# Patient Record
Sex: Male | Born: 1945 | Race: White | Hispanic: No | Marital: Married | Smoking: Never smoker
Health system: Southern US, Community
[De-identification: ages and names within clinical notes are randomized; demographics above are authoritative.]

## PROBLEM LIST (undated history)

## (undated) DIAGNOSIS — I7090 Unspecified atherosclerosis: Secondary | ICD-10-CM

## (undated) DIAGNOSIS — F419 Anxiety disorder, unspecified: Secondary | ICD-10-CM

## (undated) DIAGNOSIS — K769 Liver disease, unspecified: Secondary | ICD-10-CM

## (undated) DIAGNOSIS — K5790 Diverticulosis of intestine, part unspecified, without perforation or abscess without bleeding: Secondary | ICD-10-CM

## (undated) DIAGNOSIS — M1990 Unspecified osteoarthritis, unspecified site: Secondary | ICD-10-CM

## (undated) DIAGNOSIS — K219 Gastro-esophageal reflux disease without esophagitis: Secondary | ICD-10-CM

## (undated) DIAGNOSIS — I10 Essential (primary) hypertension: Secondary | ICD-10-CM

## (undated) DIAGNOSIS — E785 Hyperlipidemia, unspecified: Secondary | ICD-10-CM

## (undated) DIAGNOSIS — E119 Type 2 diabetes mellitus without complications: Secondary | ICD-10-CM

## (undated) DIAGNOSIS — K8070 Calculus of gallbladder and bile duct without cholecystitis without obstruction: Secondary | ICD-10-CM

## (undated) DIAGNOSIS — A419 Sepsis, unspecified organism: Secondary | ICD-10-CM

## (undated) HISTORY — PX: HEMORROIDECTOMY: SUR656

## (undated) HISTORY — PX: ELBOW ARTHROSCOPY: SUR87

## (undated) HISTORY — PX: EYE SURGERY: SHX253

## (undated) HISTORY — PX: COLONOSCOPY: SHX174

## (undated) HISTORY — PX: CATARACT EXTRACTION: SUR2

---

## 2007-11-25 ENCOUNTER — Ambulatory Visit: Payer: Self-pay | Admitting: Gastroenterology

## 2012-12-17 ENCOUNTER — Inpatient Hospital Stay: Payer: Self-pay | Admitting: Internal Medicine

## 2012-12-17 LAB — COMPREHENSIVE METABOLIC PANEL
Albumin: 4 g/dL (ref 3.4–5.0)
Anion Gap: 7 (ref 7–16)
BUN: 20 mg/dL — ABNORMAL HIGH (ref 7–18)
Bilirubin,Total: 0.6 mg/dL (ref 0.2–1.0)
Co2: 25 mmol/L (ref 21–32)
Creatinine: 1.01 mg/dL (ref 0.60–1.30)
EGFR (African American): >60
Osmolality: 276 (ref 275–301)
SGOT(AST): 26 U/L (ref 15–37)
SGPT (ALT): 31 U/L (ref 12–78)
Sodium: 136 mmol/L (ref 136–145)

## 2012-12-17 LAB — CBC
MCH: 32.6 pg (ref 26.0–34.0)
MCHC: 34.4 g/dL (ref 32.0–36.0)
WBC: 6.9 10*3/uL (ref 3.8–10.6)

## 2012-12-18 LAB — CBC WITH DIFFERENTIAL/PLATELET
Basophil #: 0.1 10*3/uL (ref 0.0–0.1)
Eosinophil #: 0 10*3/uL (ref 0.0–0.7)
Eosinophil %: 0.3 %
HCT: 36.6 % — ABNORMAL LOW (ref 40.0–52.0)
HGB: 12.6 g/dL — ABNORMAL LOW (ref 13.0–18.0)
Lymphocyte #: 1.4 10*3/uL (ref 1.0–3.6)
Lymphocyte %: 13.3 %
MCH: 32.8 pg (ref 26.0–34.0)
MCHC: 34.3 g/dL (ref 32.0–36.0)
MCV: 96 fL (ref 80–100)
Monocyte #: 0.9 x10 3/mm (ref 0.2–1.0)
Monocyte %: 9 %
Neutrophil #: 7.9 10*3/uL — ABNORMAL HIGH (ref 1.4–6.5)
Neutrophil %: 76.8 %
Platelet: 203 10*3/uL (ref 150–440)
RBC: 3.83 10*6/uL — ABNORMAL LOW (ref 4.40–5.90)
RDW: 13.5 % (ref 11.5–14.5)

## 2012-12-19 LAB — CBC WITH DIFFERENTIAL/PLATELET
Basophil %: 0.4 %
HCT: 31.7 % — ABNORMAL LOW (ref 40.0–52.0)
MCH: 33.3 pg (ref 26.0–34.0)
MCHC: 35 g/dL (ref 32.0–36.0)
Monocyte #: 1 x10 3/mm (ref 0.2–1.0)
Neutrophil #: 7.1 10*3/uL — ABNORMAL HIGH (ref 1.4–6.5)
Neutrophil %: 75.9 %
RDW: 13.3 % (ref 11.5–14.5)

## 2012-12-19 LAB — BASIC METABOLIC PANEL
Anion Gap: 5 — ABNORMAL LOW (ref 7–16)
Calcium, Total: 8.4 mg/dL — ABNORMAL LOW (ref 8.5–10.1)
Co2: 28 mmol/L (ref 21–32)
EGFR (Non-African Amer.): >60
Glucose: 102 mg/dL — ABNORMAL HIGH (ref 65–99)
Osmolality: 267 (ref 275–301)

## 2013-02-07 ENCOUNTER — Ambulatory Visit: Payer: Self-pay | Admitting: Gastroenterology

## 2013-05-11 ENCOUNTER — Ambulatory Visit: Payer: Self-pay | Admitting: Ophthalmology

## 2013-05-11 LAB — POTASSIUM: Potassium: 3.4 mmol/L — ABNORMAL LOW (ref 3.5–5.1)

## 2013-05-23 ENCOUNTER — Ambulatory Visit: Payer: Self-pay | Admitting: Ophthalmology

## 2014-05-12 NOTE — Consult Note (Signed)
Pt without further bleeding. Hgb 11.2.  Likely bled from diverticular disease.  I recommend continue clear liq diet for another 24 hours and try full liquids and if does ok could go home either tomorrow late afternoon or Monday.  I recommend prolonged clear liquids to decrease the chance that stool moving through will dislodge the clot on the diverticular site of bleeding while the clot organizes.   Electronic Signatures: Scot JunElliott, Robert T (MD)  (Signed on 29-Nov-14 10:45)  Authored  Last Updated: 29-Nov-14 10:45 by Scot JunElliott, Robert T (MD)

## 2014-05-12 NOTE — Consult Note (Signed)
Pt seen and examined. Full consult to follow. Low abd cramping with hematochezia since last night. Known hx of diverticulosis. Recent constipation and straining on defacation. Hgb stable. Hemorrhoids seen on exam. Likely diverticular bleed but hemorrhoidal bleeding also possible. Liquid diet ordered. Moniter for bleeding. Hemorhoidal cream bid.Please hold the ASA. Pt of Dr. Reyes IvanSkulskie's but Dr. Mechele CollinElliott will see tomorrow. Thanks.   Electronic Signatures: Lutricia Feilh, Zadiel Leyh (MD) (Signed on 28-Nov-14 13:08)  Authored   Last Updated: 28-Nov-14 15:16 by Lutricia Feilh, Jezreel Sisk (MD)

## 2014-05-12 NOTE — H&P (Signed)
PATIENT NAME:  Jesus White, [name]:  [id number] DATE OF BIRTH:  [birth year]  DATE OF ADMISSION:  12/17/2012  PRIMARY CARE PHYSICIAN: Marisue IvanKanhka Linthavong, MD   PRESENTING COMPLAINT: Rectal bleed.   HISTORY OF PRESENT ILLNESS: Jesus White is a very pleasant 69 year old Caucasian gentleman with history of diverticulosis, hypertension and hyperlipidemia, comes to the Emergency Room after he started having bright red blood per rectum x 3 episodes yesterday evening and 1 episode this morning, which was a milder episode. The patient said he ate [place] dinner yesterday and started having some growling in his stomach and went to use the bathroom, had 3 back to back, mild to moderate bright red blood per rectum. The patient has a history of constipation, has to strain each time he has a bowel movement. He has a history of diverticulosis but no history of rectal bleed in the past. He is hemodynamically stable, is being admitted for further evaluation and management.   PAST MEDICAL HISTORY: 1.  Diverticulosis.  2.  Hypertension.  3.  Hyperlipidemia.   ALLERGIES: No known drug allergies.   MEDICATIONS: 1.  Omeprazole 20 mg daily. 2.  Multivitamin p.o. daily.  3.  Lovastatin 40 mg 2 tablets daily.  4.  Lisinopril 20 mg 1 tablet daily.  5.  Hydrochlorothiazide 25 mg at bedtime.  6.  Doxepin 25 mg p.o. daily at bedtime.  7.  Aspirin 81 mg daily.  8.  Amlodipine 10 mg daily.   FAMILY HISTORY: Mother had congestive heart failure. Father died of cancer, but he does not know what kind. He has several siblings with hypertension.   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 Does not smoke or drink alcohol.   REVIEW OF SYSTEMS:    CONSTITUTIONAL: No fever, fatigue, weakness.  EYES: No blurred or double vision, glaucoma.  ENT: No tinnitus, ear pain, hearing loss.  RESPIRATORY: No cough, wheeze, hemoptysis, or COPD.  CARDIOVASCULAR: No chest pain, orthopnea, edema, palpitations, arrhythmia.   GASTROINTESTINAL: Positive for rectal bleed and constipation. No nausea, vomiting, or diarrhea.  GENITOURINARY: No dysuria or hematuria.  ENDOCRINE: No polyuria, nocturia, or thyroid problems.  HEMATOLOGY: No anemia or easy bruising or bleeding disorder.  SKIN: No acne, rash, or lesions or dermatitis.  MUSCULOSKELETAL: No arthritis, back pain, or gout.  NEUROLOGIC: No CVA, TIA, dysarthria, vertigo, or tremors.  PSYCHIATRIC: No anxiety, depression, or bipolar disorder.   All other systems reviewed and negative.   PHYSICAL EXAM: GENERAL: The patient is awake, alert, oriented x 3, not in acute distress.  VITAL SIGNS: Afebrile. Pulse is 93. Blood pressure is 169/97. Pulse oximetry is 98% on room air.  HEENT: Atraumatic, normocephalic. Pupils: PERRLA. EOM intact. Oral mucosa is moist.  NECK: Supple. No JVD. No carotid bruit.  LUNGS: Clear to auscultation bilaterally. No rales, rhonchi, respiratory distress, or labored breathing.  HEART: Both the heart sounds are normal. Rate, rhythm regular. PMI not lateralized. Chest nontender.  EXTREMITIES: Good pedal pulses, good femoral pulses. No lower extremity edema.  ABDOMEN: Soft, benign, nontender. No organomegaly. Positive bowel sounds.  NEUROLOGIC: Grossly intact cranial nerves II through XII. No motor or sensory deficit.  PSYCHIATRIC: The patient is awake, alert, oriented x 3.   LABORATORY DATA:  CBC within normal limits. Comprehensive metabolic panel within normal limits. Lipase is 172.   ASSESSMENT: A 69 year old Jesus White with history of hypertension and hyperlipidemia comes in with 3 to 4 bright red blood per rectal stools. He is being admitted with:  1.  Lower gastrointestinal bleed/rectal bleed/hematochezia: Suspected diverticular bleed versus could be hemorrhoidal as well since hemorrhoids were palpated on rectal exam per ER physician and per Dr. Bluford Kaufmann. The patient will be admitted on the medical floor. Will monitor hemoglobin and  hematocrit. Continue IV fluids. Will give him clear liquid diet and GI consultation with Dr. Bluford Kaufmann. The case was discussed with Dr. Bluford Kaufmann.  2.  Tachycardia, likely in the setting of rectal bleed: Will continue to monitor.  3.  Hypertension, uncontrolled: Will continue all home medications, which are amlodipine, hydrochlorothiazide, lisinopril.  4.  Hyperlipidemia: Continue statins.  5.  Deep vein thrombosis prophylaxis: The patient is ambulatory.  6.  Further work-up according to the patient's clinical course. Hospital admission plan was discussed with the patient and the patient's wife, who was present in the Emergency Room.   TIME SPENT: 55 minutes.    ____________________________ Wylie Hail Allena Katz, MD sap:jcm D: 12/17/2012 13:16:25 ET T: 12/17/2012 13:31:03 ET JOB#: [id number]  cc: Telitha Plath A. Allena Katz, MD, <Dictator> Willow Ora MD ELECTRONICALLY SIGNED 12/31/2012 11:06

## 2014-05-12 NOTE — Discharge Summary (Signed)
PATIENT NAME:  Jesus White, Remer H MR#:  [id number] DATE OF BIRTH:  Dec 16, 1945  DATE OF ADMISSION:  12/17/2012 DATE OF DISCHARGE:  12/19/2012  DISCHARGE DIAGNOSES: 1.  Anemia, acute from lower gastrointestinal bleed of unknown cause. 2.  Hyperlipidemia.  3.  Hypertension.   DISCHARGE MEDICATIONS: [facility]. Main changes reflect bleeding in that he will stop his aspirin for a few days until seen by Dr. Burnadette PopLinthavong later this week, then will consider restarting if necessary clinically per his primary care doctor. He also may use over-the-counter medication to combat GI constipation, specifically suggested stool softener daily and MiraLax as needed.   HISTORY AND PHYSICAL: Please see detailed history and physical done on admission.   HOSPITAL COURSE: The patient was admitted with rectal bleeding with a hemoglobin initially of 13, decreased to 11.2 with hydration. His bleeding stopped after being admitted. He was on a clear liquid diet and did well without any further bleeding. He was seen by 2 members of the GI service. Colonoscopy was elected to be done later. Since he tolerated p.o. well to this point, will advance this to a full liquid over the course of the day today and discharge him later in the afternoon if he bleeds no further. Discussed this with he and his wife in depth. Will replace his hypokalemia today as well prior to discharge.   TIME SPENT: It took approximately 35 minutes to do discharge tasks.   ____________________________ Marya AmslerMarshall W. Dareen PianoAnderson, MD mwa:cs D: 12/19/2012 10:41:05 ET T: 12/19/2012 19:28:56 ET JOB#: [id number]  cc: Marya AmslerMarshall W. Dareen PianoAnderson, MD, <Dictator> Lauro RegulusMARSHALL W Gorje Iyer MD ELECTRONICALLY SIGNED 12/20/2012 8:01

## 2014-05-12 NOTE — Consult Note (Signed)
PATIENT NAME:  Jesus White, [name] MR#:  [id number] DATE OF BIRTH:  Mar 11, 1945  DATE OF CONSULTATION:  12/17/2012  CONSULTING PHYSICIAN:  Ezzard StandingPaul Y. Bluford Kaufmannh, MD  REASON FOR REFERRAL: Rectal bleeding.   HISTORY OF PRESENT ILLNESS: The patient is a 69 year old white male with a known history of diverticulosis who comes to the Emergency Room with bright red blood per rectum that started last night, associated with some lower abdominal discomfort and cramping. He also had a bout of rectal bleeding this morning. He ate a [place] dinner last night and had some growling in the stomach, after which he started having some bleeding. He has been having some constipation issues and had to strain each time he had a bowel movement.   He had a colonoscopy by Dr. Marva PandaSkulskie in 2009, which clearly showed evidence of left-sided diverticulosis. The patient has had diverticulosis in the past. The patient is currently stable without any complaints.   PAST MEDICAL HISTORY: Notable for hypertension, hyperlipidemia, and diverticulosis.   ALLERGIES: He has no known drug allergies.   MEDICATIONS: Include omeprazole 20 mg daily, lovastatin 40 mg twice a day, lisinopril 20 mg a day, hydrochlorothiazide 25 mg at bedtime, baby aspirin, doxepin at bedtime, and multivitamins. He also takes amlodipine 10 mg daily.  FAMILY HISTORY: Notable for cancer and heart failure and hypertension.   SOCIAL HISTORY: The patient does not smoke or drink.   REVIEW OF SYSTEMS: There are no fevers or chills or fatigue or weakness. There is no chest pain or palpitations or cough. No shortness of breath. There are no visual or hearing changes. GI symptoms have been described already. There is no nausea, vomiting, or heartburn or indigestion.   PHYSICAL EXAMINATION:   GENERAL: He is in no acute distress.  VITAL SIGNS: He is afebrile. Vital signs are stable.  HEAD AND NECK: Exams were within normal limits. CARDIAC: Regular rhythm and rate.  LUNGS: Clear  bilaterally.  ABDOMEN: Normoactive bowel sounds, soft. There is some mild tenderness in the lower abdomen. Has active bowel signs.  EXTREMITIES: No edema.  RECTAL: Examination in the Emergency Room showed evidence of heme-positive stool and external hemorrhoids.  LABORATORY DATA: Electrolytes are normal. BUN is 20. Creatinine is 1.01. Liver enzymes are normal. White count is 6.9. Hemoglobin is 13.0.   ASSESSMENT AND PLAN: This is a patient with known history of diverticulosis who has rectal bleeding. It is likely that he has diverticular bleed. However, he could also have hemorrhoidal bleeding. I will recommend giving some hemorrhoidal creams. We need to monitor his hemoglobin. We should hold the aspirin for now. Hopefully the bleeding will stopped. If it does not stop, then further evaluation will need to be done over the next few days. Dr. Mechele CollinElliott will see the patient tomorrow. Dr. Marva PandaSkulskie is his primary GI doctor.   Thank you for the referral.   ____________________________ Ezzard StandingPaul Y. Bluford Kaufmannh, MD pyo:jcm D: 12/17/2012 15:16:04 ET T: 12/17/2012 15:44:55 ET JOB#: [id number]  cc: Ezzard StandingPaul Y. Bluford Kaufmannh, MD, <Dictator> Ezzard StandingPAUL Y Billie Trager MD ELECTRONICALLY SIGNED 12/18/2012 11:36

## 2014-05-13 NOTE — Op Note (Signed)
PATIENT NAME:  Jesus White, Jesus White MR#:  [id number] DATE OF BIRTH:  Aug 22, 1945  DATE OF PROCEDURE:  05/23/2013  PREOPERATIVE DIAGNOSIS: Cataract, right eye.   POSTOPERATIVE DIAGNOSIS: Cataract, right eye.  PROCEDURE PERFORMED:  Extracapsular cataract extraction using phacoemulsification with placement Alcon [id number], 19.0 diopter posterior chamber lens, serial number [id number].   SURGEON: Maylon PeppersSteven A. Jaisean Monteforte, M.D.   ANESTHESIA: 4% lidocaine and 0.75% Marcaine a 50-50 mixture with 10 units/mL of Hylenex added, given as a peribulbar.   ANESTHESIOLOGIST: Dr. Henrene HawkingKephart.   COMPLICATIONS: None.   ESTIMATED BLOOD LOSS: Less than 1 mL.   DESCRIPTION OF PROCEDURE:  The patient was brought to the operating room and given a peribulbar block.  The patient was then prepped and draped in the usual fashion.  The vertical rectus muscles were imbricated using 5-0 silk sutures.  These sutures were then clamped to the sterile drapes as bridle sutures.  A limbal peritomy was performed extending two clock hours and hemostasis was obtained with cautery.  A partial thickness scleral groove was made at the surgical limbus and dissected anteriorly in a lamellar dissection using an Alcon crescent knife.  The anterior chamber was entered superonasally with a Superblade and through the lamellar dissection with a 2.6 mm keratome.  DisCoVisc was used to replace the aqueous and a continuous tear capsulorrhexis was carried out.  Hydrodissection and hydrodelineation were carried out with balanced salt and a 27 gauge canula.  The nucleus was rotated to confirm the effectiveness of the hydrodissection.  Phacoemulsification was carried out using a divide-and-conquer technique.  Total ultrasound time was 57.5 seconds with an average power of 23.9%. CDE of 24.27.  Irrigation/aspiration was used to remove the residual cortex.  DisCoVisc was used to inflate the capsule and the internal incision was enlarged to 3 mm with the crescent  knife.  The intraocular lens was folded and inserted into the capsular bag using the AcrySert delivery system.  Irrigation/aspiration was used to remove the residual DisCoVisc.  Miostat was injected into the anterior chamber through the paracentesis track to inflate the anterior chamber and induce miosis.  0.1 mL of cefuroxime was injected via the paracentesis tract containing 1 mg of drug.  The wound was checked for leaks and none were found. The conjunctiva was closed with cautery and the bridle sutures were removed.  Two drops of 0.3% Vigamox were placed on the eye.   An eye shield was placed on the eye.  The patient was discharged to the recovery room in good condition.    ____________________________ Maylon PeppersSteven A. Adalyn Pennock, MD sad:dmm D: 05/23/2013 12:47:07 ET T: 05/23/2013 13:07:40 ET JOB#: [id number]  cc: Viviann SpareSteven A. Patriciaann Rabanal, MD, <Dictator> Erline LevineSTEVEN A Kazue Cerro MD ELECTRONICALLY SIGNED 05/30/2013 11:38

## 2014-11-04 ENCOUNTER — Emergency Department: Payer: Medicare Other

## 2014-11-04 ENCOUNTER — Encounter (HOSPITAL_COMMUNITY): Payer: Self-pay | Admitting: Internal Medicine

## 2014-11-04 ENCOUNTER — Inpatient Hospital Stay (HOSPITAL_COMMUNITY)
Admission: AD | Admit: 2014-11-04 | Discharge: 2014-11-08 | DRG: 417 | Disposition: A | Discharge status: Home / Self Care | Payer: Medicare Other | Source: Other Acute Inpatient Hospital | Attending: Internal Medicine | Admitting: Internal Medicine

## 2014-11-04 ENCOUNTER — Emergency Department
Admission: EM | Admit: 2014-11-04 | Discharge: 2014-11-04 | Disposition: A | Discharge status: Other Acute Inpatient Hospital | Payer: Medicare Other | Attending: Student | Admitting: Student

## 2014-11-04 DIAGNOSIS — E785 Hyperlipidemia, unspecified: Secondary | ICD-10-CM | POA: Diagnosis present

## 2014-11-04 DIAGNOSIS — R509 Fever, unspecified: Secondary | ICD-10-CM

## 2014-11-04 DIAGNOSIS — R410 Disorientation, unspecified: Secondary | ICD-10-CM | POA: Insufficient documentation

## 2014-11-04 DIAGNOSIS — K8064 Calculus of gallbladder and bile duct with chronic cholecystitis without obstruction: Secondary | ICD-10-CM | POA: Diagnosis present

## 2014-11-04 DIAGNOSIS — I2510 Atherosclerotic heart disease of native coronary artery without angina pectoris: Secondary | ICD-10-CM | POA: Diagnosis present

## 2014-11-04 DIAGNOSIS — K769 Liver disease, unspecified: Secondary | ICD-10-CM | POA: Diagnosis not present

## 2014-11-04 DIAGNOSIS — I10 Essential (primary) hypertension: Secondary | ICD-10-CM | POA: Diagnosis present

## 2014-11-04 DIAGNOSIS — K8050 Calculus of bile duct without cholangitis or cholecystitis without obstruction: Secondary | ICD-10-CM | POA: Diagnosis not present

## 2014-11-04 DIAGNOSIS — K830 Cholangitis: Secondary | ICD-10-CM | POA: Diagnosis present

## 2014-11-04 DIAGNOSIS — E872 Acidosis, unspecified: Secondary | ICD-10-CM

## 2014-11-04 DIAGNOSIS — K8013 Calculus of gallbladder with acute and chronic cholecystitis with obstruction: Principal | ICD-10-CM | POA: Diagnosis present

## 2014-11-04 DIAGNOSIS — R4182 Altered mental status, unspecified: Secondary | ICD-10-CM | POA: Diagnosis present

## 2014-11-04 DIAGNOSIS — G9340 Encephalopathy, unspecified: Secondary | ICD-10-CM | POA: Diagnosis present

## 2014-11-04 DIAGNOSIS — R52 Pain, unspecified: Secondary | ICD-10-CM

## 2014-11-04 DIAGNOSIS — R740 Nonspecific elevation of levels of transaminase and lactic acid dehydrogenase [LDH]: Secondary | ICD-10-CM | POA: Diagnosis not present

## 2014-11-04 DIAGNOSIS — R7401 Elevation of levels of liver transaminase levels: Secondary | ICD-10-CM | POA: Diagnosis present

## 2014-11-04 DIAGNOSIS — R000 Tachycardia, unspecified: Secondary | ICD-10-CM | POA: Diagnosis not present

## 2014-11-04 DIAGNOSIS — E876 Hypokalemia: Secondary | ICD-10-CM | POA: Diagnosis present

## 2014-11-04 DIAGNOSIS — F419 Anxiety disorder, unspecified: Secondary | ICD-10-CM | POA: Diagnosis not present

## 2014-11-04 DIAGNOSIS — R6510 Systemic inflammatory response syndrome (SIRS) of non-infectious origin without acute organ dysfunction: Secondary | ICD-10-CM | POA: Diagnosis present

## 2014-11-04 DIAGNOSIS — A419 Sepsis, unspecified organism: Secondary | ICD-10-CM | POA: Insufficient documentation

## 2014-11-04 DIAGNOSIS — K8070 Calculus of gallbladder and bile duct without cholecystitis without obstruction: Secondary | ICD-10-CM | POA: Diagnosis present

## 2014-11-04 HISTORY — DX: Essential (primary) hypertension: I10

## 2014-11-04 HISTORY — DX: Sepsis, unspecified organism: A41.9

## 2014-11-04 HISTORY — DX: Liver disease, unspecified: K76.9

## 2014-11-04 HISTORY — DX: Diverticulosis of intestine, part unspecified, without perforation or abscess without bleeding: K57.90

## 2014-11-04 HISTORY — DX: Calculus of gallbladder and bile duct without cholecystitis without obstruction: K80.70

## 2014-11-04 HISTORY — DX: Unspecified atherosclerosis: I70.90

## 2014-11-04 HISTORY — DX: Hyperlipidemia, unspecified: E78.5

## 2014-11-04 HISTORY — DX: Anxiety disorder, unspecified: F41.9

## 2014-11-04 LAB — COMPREHENSIVE METABOLIC PANEL
ALBUMIN: 3.8 g/dL (ref 3.5–5.0)
ALBUMIN: 2.8 g/dL — AB (ref 3.5–5.0)
ALK PHOS: 131 U/L — AB (ref 38–126)
ALT: 188 U/L — AB (ref 17–63)
ALT: 140 U/L — AB (ref 17–63)
ANION GAP: 10 (ref 5–15)
AST: 85 U/L — AB (ref 15–41)
AST: 59 U/L — AB (ref 15–41)
Alkaline Phosphatase: 180 U/L — ABNORMAL HIGH (ref 38–126)
Anion gap: 16 — ABNORMAL HIGH (ref 5–15)
BILIRUBIN TOTAL: 1.3 mg/dL — AB (ref 0.3–1.2)
BUN: 13 mg/dL (ref 6–20)
BUN: 7 mg/dL (ref 6–20)
CHLORIDE: 100 mmol/L — AB (ref 101–111)
CHLORIDE: 97 mmol/L — AB (ref 101–111)
CO2: 23 mmol/L (ref 22–32)
CO2: 25 mmol/L (ref 22–32)
CREATININE: 1.08 mg/dL (ref 0.61–1.24)
CREATININE: 0.83 mg/dL (ref 0.61–1.24)
Calcium: 9 mg/dL (ref 8.9–10.3)
Calcium: 7.6 mg/dL — ABNORMAL LOW (ref 8.9–10.3)
GFR calc Af Amer: >60 mL/min (ref >60)
GLUCOSE: 147 mg/dL — AB (ref 65–99)
Glucose, Bld: 161 mg/dL — ABNORMAL HIGH (ref 65–99)
POTASSIUM: 2.8 mmol/L — AB (ref 3.5–5.1)
Potassium: 3.3 mmol/L — ABNORMAL LOW (ref 3.5–5.1)
SODIUM: 132 mmol/L — AB (ref 135–145)
Sodium: 139 mmol/L (ref 135–145)
Total Bilirubin: 1.3 mg/dL — ABNORMAL HIGH (ref 0.3–1.2)
Total Protein: 7 g/dL (ref 6.5–8.1)
Total Protein: 5.6 g/dL — ABNORMAL LOW (ref 6.5–8.1)

## 2014-11-04 LAB — LACTIC ACID, PLASMA
LACTIC ACID, VENOUS: 6 mmol/L — AB (ref 0.5–2.0)
Lactic Acid, Venous: 1.5 mmol/L (ref 0.5–2.0)
Lactic Acid, Venous: 1.5 mmol/L (ref 0.5–2.0)

## 2014-11-04 LAB — URINALYSIS COMPLETE WITH MICROSCOPIC (ARMC ONLY)
BACTERIA UA: NONE SEEN
BILIRUBIN URINE: NEGATIVE
Glucose, UA: NEGATIVE mg/dL
HGB URINE DIPSTICK: NEGATIVE
KETONES UR: NEGATIVE mg/dL
LEUKOCYTES UA: NEGATIVE
NITRITE: NEGATIVE
PH: 7 (ref 5.0–8.0)
PROTEIN: NEGATIVE mg/dL
SPECIFIC GRAVITY, URINE: 1.01 (ref 1.005–1.030)

## 2014-11-04 LAB — CBC WITH DIFFERENTIAL/PLATELET
BASOS ABS: 0 10*3/uL (ref 0.0–0.1)
BASOS PCT: 0 %
Basophils Absolute: 0 10*3/uL (ref 0–0.1)
Basophils Relative: 0 %
EOS ABS: 0 10*3/uL (ref 0–0.7)
EOS ABS: 0 10*3/uL (ref 0.0–0.7)
EOS PCT: 0 %
EOS PCT: 0 %
HCT: 44.4 % (ref 40.0–52.0)
HCT: 38.4 % — ABNORMAL LOW (ref 39.0–52.0)
HEMOGLOBIN: 13.1 g/dL (ref 13.0–17.0)
Hemoglobin: 15 g/dL (ref 13.0–18.0)
LYMPHS ABS: 0.4 10*3/uL — AB (ref 1.0–3.6)
LYMPHS ABS: 0.6 10*3/uL — AB (ref 0.7–4.0)
LYMPHS PCT: 3 %
Lymphocytes Relative: 3 %
MCH: 31.6 pg (ref 26.0–34.0)
MCH: 32 pg (ref 26.0–34.0)
MCHC: 33.9 g/dL (ref 32.0–36.0)
MCHC: 34.1 g/dL (ref 30.0–36.0)
MCV: 93.4 fL (ref 80.0–100.0)
MCV: 93.7 fL (ref 78.0–100.0)
Monocytes Absolute: 0.1 10*3/uL — ABNORMAL LOW (ref 0.2–1.0)
Monocytes Absolute: 0.9 10*3/uL (ref 0.1–1.0)
Monocytes Relative: 1 %
Monocytes Relative: 5 %
NEUTROS PCT: 92 %
Neutro Abs: 12.3 10*3/uL — ABNORMAL HIGH (ref 1.4–6.5)
Neutro Abs: 16 10*3/uL — ABNORMAL HIGH (ref 1.7–7.7)
Neutrophils Relative %: 96 %
PLATELETS: 152 10*3/uL (ref 150–440)
PLATELETS: 140 10*3/uL — AB (ref 150–400)
RBC: 4.75 MIL/uL (ref 4.40–5.90)
RBC: 4.1 MIL/uL — AB (ref 4.22–5.81)
RDW: 13.5 % (ref 11.5–14.5)
RDW: 13.2 % (ref 11.5–15.5)
WBC: 12.9 10*3/uL — ABNORMAL HIGH (ref 3.8–10.6)
WBC: 17.5 10*3/uL — AB (ref 4.0–10.5)

## 2014-11-04 LAB — APTT: aPTT: 29 seconds (ref 24–37)

## 2014-11-04 LAB — PROTIME-INR
INR: 1.08 (ref 0.00–1.49)
PROTHROMBIN TIME: 14.2 s (ref 11.6–15.2)

## 2014-11-04 LAB — TROPONIN I: Troponin I: <0.03 ng/mL (ref <0.031)

## 2014-11-04 LAB — MAGNESIUM: MAGNESIUM: 1.6 mg/dL — AB (ref 1.7–2.4)

## 2014-11-04 LAB — PROCALCITONIN: PROCALCITONIN: 8.51 ng/mL

## 2014-11-04 MED ORDER — SODIUM CHLORIDE 0.9 % IV BOLUS (SEPSIS)
1000.0000 mL | INTRAVENOUS | Status: AC
  Administered 2014-11-04 (×2): 1000 mL via INTRAVENOUS

## 2014-11-04 MED ORDER — IOHEXOL 350 MG/ML SOLN
100.0000 mL | Freq: Once | INTRAVENOUS | Status: AC | PRN
  Administered 2014-11-04: 100 mL via INTRAVENOUS

## 2014-11-04 MED ORDER — HYDROMORPHONE HCL 1 MG/ML IJ SOLN: 0.5000 mg | INTRAMUSCULAR | Status: DC | PRN

## 2014-11-04 MED ORDER — VANCOMYCIN HCL IN DEXTROSE 1-5 GM/200ML-% IV SOLN
1000.0000 mg | Freq: Once | INTRAVENOUS | Status: AC
  Administered 2014-11-04: 1000 mg via INTRAVENOUS
  Filled 2014-11-04: qty 200

## 2014-11-04 MED ORDER — POTASSIUM CHLORIDE 10 MEQ/100ML IV SOLN
10.0000 meq | INTRAVENOUS | Status: DC
  Filled 2014-11-04: qty 100

## 2014-11-04 MED ORDER — PIPERACILLIN-TAZOBACTAM 3.375 G IVPB
3.3750 g | Freq: Once | INTRAVENOUS | Status: AC
  Administered 2014-11-04: 3.375 g via INTRAVENOUS
  Filled 2014-11-04: qty 50

## 2014-11-04 MED ORDER — ACETAMINOPHEN 325 MG PO TABS: 650.0000 mg | ORAL_TABLET | Freq: Four times a day (QID) | ORAL | Status: DC | PRN

## 2014-11-04 MED ORDER — ENOXAPARIN SODIUM 40 MG/0.4ML ~~LOC~~ SOLN
40.0000 mg | SUBCUTANEOUS | Status: DC
  Administered 2014-11-04: 40 mg via SUBCUTANEOUS
  Filled 2014-11-04: qty 0.4

## 2014-11-04 MED ORDER — ONDANSETRON HCL 4 MG/2ML IJ SOLN: 4.0000 mg | Freq: Four times a day (QID) | INTRAMUSCULAR | Status: DC | PRN

## 2014-11-04 MED ORDER — SODIUM CHLORIDE 0.9 % IJ SOLN
3.0000 mL | Freq: Two times a day (BID) | INTRAMUSCULAR | Status: DC
  Administered 2014-11-04 – 2014-11-06 (×4): 3 mL via INTRAVENOUS
  Administered 2014-11-06: 10 mL via INTRAVENOUS
  Administered 2014-11-07 – 2014-11-08 (×3): 3 mL via INTRAVENOUS

## 2014-11-04 MED ORDER — PIPERACILLIN-TAZOBACTAM 3.375 G IVPB
3.3750 g | Freq: Three times a day (TID) | INTRAVENOUS | Status: DC
  Administered 2014-11-04 – 2014-11-08 (×12): 3.375 g via INTRAVENOUS
  Filled 2014-11-04 (×15): qty 50

## 2014-11-04 MED ORDER — POTASSIUM CHLORIDE 10 MEQ/100ML IV SOLN
10.0000 meq | INTRAVENOUS | Status: AC
  Administered 2014-11-04 (×2): 10 meq via INTRAVENOUS
  Filled 2014-11-04 (×2): qty 100

## 2014-11-04 MED ORDER — ACETAMINOPHEN 650 MG RE SUPP
RECTAL | Status: AC
  Administered 2014-11-04: 650 mg via RECTAL
  Filled 2014-11-04: qty 1

## 2014-11-04 MED ORDER — ACETAMINOPHEN 650 MG RE SUPP
650.0000 mg | Freq: Four times a day (QID) | RECTAL | Status: DC | PRN
  Administered 2014-11-05: 650 mg via RECTAL
  Filled 2014-11-04: qty 1

## 2014-11-04 MED ORDER — ACETAMINOPHEN 650 MG RE SUPP
650.0000 mg | Freq: Once | RECTAL | Status: AC
  Administered 2014-11-04: 650 mg via RECTAL

## 2014-11-04 MED ORDER — SODIUM CHLORIDE 0.9 % IV SOLN
INTRAVENOUS | Status: DC
  Administered 2014-11-04 – 2014-11-08 (×5): via INTRAVENOUS

## 2014-11-04 MED ORDER — SODIUM CHLORIDE 0.9 % IV SOLN: INTRAVENOUS | Status: DC

## 2014-11-04 MED ORDER — ONDANSETRON HCL 4 MG PO TABS
4.0000 mg | ORAL_TABLET | Freq: Four times a day (QID) | ORAL | Status: DC | PRN
Start: 2014-11-04 — End: 2014-11-08

## 2014-11-04 MED ORDER — POTASSIUM CHLORIDE 10 MEQ/100ML IV SOLN
10.0000 meq | INTRAVENOUS | Status: AC
  Administered 2014-11-04 (×4): 10 meq via INTRAVENOUS
  Filled 2014-11-04 (×4): qty 100

## 2014-11-04 MED ORDER — PIPERACILLIN-TAZOBACTAM 3.375 G IVPB 30 MIN
3.3750 g | Freq: Once | INTRAVENOUS | Status: DC
Start: 2014-11-04 — End: 2014-11-04
  Filled 2014-11-04: qty 50

## 2014-11-04 MED ORDER — VANCOMYCIN HCL IN DEXTROSE 1-5 GM/200ML-% IV SOLN
1000.0000 mg | Freq: Two times a day (BID) | INTRAVENOUS | Status: DC
  Administered 2014-11-05 – 2014-11-08 (×7): 1000 mg via INTRAVENOUS
  Filled 2014-11-04 (×9): qty 200

## 2014-11-04 MED ORDER — SODIUM CHLORIDE 0.9 % IV BOLUS (SEPSIS)
1000.0000 mL | Freq: Once | INTRAVENOUS | Status: AC
Start: 2014-11-04 — End: 2014-11-04
  Administered 2014-11-04: 1000 mL via INTRAVENOUS

## 2014-11-04 MED ORDER — CETYLPYRIDINIUM CHLORIDE 0.05 % MT LIQD
7.0000 mL | Freq: Two times a day (BID) | OROMUCOSAL | Status: DC
  Administered 2014-11-05 – 2014-11-08 (×7): 7 mL via OROMUCOSAL

## 2014-11-04 NOTE — ED Provider Notes (Signed)
[facility] Emergency Department Provider Note  ____________________________________________  Time seen: Approximately 505 AM  I have reviewed the triage vital signs and the nursing notes.   HISTORY  Chief Complaint Altered Mental Status  History limited by patient AMS, obtained by wife and EMS   HPI Jesus White is a 69 y.o. male who was brought into the hospital for unresponsiveness. According to the EMS the patient's wife was unable to arouse the patient when she reported 6 AM. According to EMS when they arrived the patient was still unresponsive and had a temperature axillary of 102.8. He reports that while they were at the hospital he did seem to wake up but was still somewhat confused. According to EMS the patient was not ill recently and they did put cooling packs on his armpits and his groin. He was last seen normal around 4 AM. I did speak with the patient's wifereports that he had had some abdominal pain yesterday after dinner. She reports that he also did not eat much for dinner. She reports that he woke up around 3 AM complaining of feeling chilly and wanting some close. The patient's wife reports that she thinks that one point he also woke up and vomited. She denies any diarrhea that she knows of. The patient's wife reports she was woken up again at 6 AM with the patient thrashing around. She opened her eyes to find him half pain off the bed and not responsive. At that time the patient's wife is sided to call EMS. She denies any other complaints no coughs no fevers noted at home, no problems with urination. The only complaint was that the patient had some abdominal pain and chills earlier.   Past Medical History  Diagnosis Date  . Hypertension   . Hyperlipemia     There are no active problems to display for this patient.   Past Surgical History  Procedure Laterality Date  . Colonoscopy      No current outpatient prescriptions on  file.  Allergies Review of patient's allergies indicates no known allergies.  No family history on file.  Social History Social History  Substance Use Topics  . Smoking status: None  . Smokeless tobacco: None  . Alcohol Use: None    Review of Systems Constitutional:  fever/chills Eyes: No visual changes. ENT: No sore throat. Cardiovascular: Denies chest pain. Respiratory: Denies shortness of breath. Gastrointestinal: abdominal pain.  And vomiting.  No diarrhea.  No constipation. Genitourinary: Negative for dysuria. Musculoskeletal: Negative for back pain. Skin: Negative for rash. Neurological: Confusion  _____________________________   PHYSICAL EXAM:  VITAL SIGNS: T 102.1, HR 128, RR 23, BP 130/70, O2 sats 89%  Constitutional: Alert and oriented self and place, confused. Well appearing and in no acute distress. Eyes: Conjunctivae are normal. PERRL. EOMI. Head: Atraumatic. Nose: No congestion/rhinnorhea. Mouth/Throat: Mucous membranes are moist.  Oropharynx non-erythematous. Cardiovascular: tachycardia, regular rhythm. Grossly normal heart sounds.  Good peripheral circulation. Respiratory: Normal respiratory effort.  No retractions. Lungs CTAB. Gastrointestinal: Soft and nontender. No distention. Positive bowel sounds Genitourinary: normal male genitalia Musculoskeletal: No lower extremity tenderness nor edema.   Neurologic:  Confused, slow to respond, slow to follow commands, generalized weakness with equal strength, symmetric smile Skin:  Skin is warm, dry and intact. No rash noted. Psychiatric: confused   ____________________________________________   LABS (all labs ordered are listed, but only abnormal results are displayed)  Labs Reviewed  COMPREHENSIVE METABOLIC PANEL - Abnormal; Notable for the following:  Potassium 2.8 (*)    Chloride 100 (*)    Glucose, Bld 147 (*)    AST 85 (*)    ALT 188 (*)    Alkaline Phosphatase 180 (*)    Total Bilirubin  1.3 (*)    Anion gap 16 (*)    All other components within normal limits  CBC WITH DIFFERENTIAL/PLATELET - Abnormal; Notable for the following:    WBC 12.9 (*)    Neutro Abs 12.3 (*)    Lymphs Abs 0.4 (*)    Monocytes Absolute 0.1 (*)    All other components within normal limits  LACTIC ACID, PLASMA - Abnormal; Notable for the following:    Lactic Acid, Venous 6.0 (*)    All other components within normal limits  URINALYSIS COMPLETEWITH MICROSCOPIC ([facility] ONLY) - Abnormal; Notable for the following:    Color, Urine YELLOW (*)    APPearance CLEAR (*)    Squamous Epithelial / LPF 0-5 (*)    All other components within normal limits  CULTURE, BLOOD (ROUTINE X 2)  CULTURE, BLOOD (ROUTINE X 2)  URINE CULTURE  TROPONIN I  LACTIC ACID, PLASMA   ____________________________________________  EKG  ED ECG REPORT I, Rebecka Apley, the attending physician, personally viewed and interpreted this ECG.   Date: 11/04/2014  EKG Time: 626  Rate: 129  Rhythm: sinus tachycardia  Axis: normal  Intervals:none  ST&T Change: none  ____________________________________________  RADIOLOGY  CT head: No acute intracranial abnormalities, mild chronic microvascular ischemic changes in cerebral white matter CXR: Shallow inspiration with atelectasis in the lung bases ____________________________________________   PROCEDURES  Procedure(s) performed: None  Critical Care performed: Yes, see critical care note(s)   CRITICAL CARE Performed by: Lucrezia Europe P   Total critical care time: 45 min  Critical care time was exclusive of separately billable procedures and treating other patients.  Critical care was necessary to treat or prevent imminent or life-threatening deterioration.  Critical care was time spent personally by me on the following activities: development of treatment plan with patient and/or surrogate as well as nursing, discussions with consultants, evaluation of  patient's response to treatment, examination of patient, obtaining history from patient or surrogate, ordering and performing treatments and interventions, ordering and review of laboratory studies, ordering and review of radiographic studies, pulse oximetry and re-evaluation of patient's condition.  ____________________________________________   INITIAL IMPRESSION / ASSESSMENT AND PLAN / ED COURSE  Pertinent labs & imaging results that were available during my care of the patient were reviewed by me and considered in my medical decision making (see chart for details).  the patient is a 69 year old male who comes in with fever, tachycardia and altered mental status. Given the patient's symptoms I'm concerned that the patient may have sepsis. When the patient arrived to the hospital I did call a code sepsis and started the protocols. The patient was ordered 3 L of normal saline which was 30 ML's per kilo, he was also ordered vancomycin and Zosyn for an unknown infection. The patient was given some Tylenol rectally to help decrease his temperature 102. The patient's chest x-ray does not show a pneumonia and his urine is unremarkable. I also did a CT scan as the patient was confused. Given the patient's history of abdominal pain I did order an abdominal CT as well as a chest CT. The patient also has a severely elevated lactic acidosis of 6. The patient will have his lactic acid reevaluated and his care will be  signed out to Dr. Toney RakesEryka Gayle who will follow-up the results of the CT and admit the patient to the hospitalist service. I discussed this with the patient's wife and she has no further concerns at this time. _________________________________________   FINAL CLINICAL IMPRESSION(S) / ED DIAGNOSES  Final diagnoses:  Sepsis, due to unspecified organism (HCC)  Tachycardia  Lactic acidosis      Rebecka ApleyAllison P Webster, MD 11/04/14 [phone]

## 2014-11-04 NOTE — H&P (Signed)
[facility] History and Physical  Jesus White:[id number] DOB: 1945-02-20 DOA: 11/04/2014  Referring physician: Randell Loop PCP: Marisue Ivan, MD   Chief Complaint: encephalopathy  HPI: Jesus White is a 69 y.o. male with a past medical history that includes hypertension, hyperlipidemia, anxiety presents to room 34 on 5 W. at [facility] hospital from the emergency department at [facility] with fever, tachycardia and acute encephalopathy. Initial workup at [place] includes CT of the abdomen pelvis concerning for choledocholithiasis with cholelithiasis. He was transferred to [place] is GI services were not immediately available.  Information is obtained from the chart and the patient. Patient reports having chills feeling "feverish" yesterday worsening in the evening to the point he could not "stop shaking" he reports his next memory is waking up in the [facility] emergency department. Chart indicates patient was brought to the hospital as his wife found him unresponsive early this morning. EMS reports when they arrived he had an axillary temp of 102.8. It is noted that he was given 500 mils of normal saline in and route he became more alert but remained confused. He was provided with cooling packs. Wife reported patient did complain of some abdominal pain the prior day. Wife indicated at 4am patient restless in bed and she believes he had emesis. No report of diarrhea.  She did indicate he was not eating or drinking as he normally does.  Indicates upon arrival to the [facility] emergency department he had fever was tachycardic and confused. A code sepsis was called patient was given 3 L of normal saline he was also provided with vancomycin and Zosyn for an unknown infection. Additionally he received Tylenol rectally. Chest x-ray was unremarkable, urinalysis unremarkable, CT of the abdomen concerning for choledoholithiasis with cholelithiasis. Lactic acid 6.2 and  within normal limits upon arrival to [facility]. Chart indicates gastroenterology not available so patient was transferred and Dr. Madilyn Fireman with [facility] gastroenterology will consult.  Upon his arrival to the floor he is alert but appears slightly dazed max temp 99.1 hemodynamically stable and not hypoxic.   Review of Systems:  10 point review of systems complete and all systems are negative except as indicated in the history of present illness Past Medical History  Diagnosis Date  . Hypertension   . Hyperlipemia   . Anxiety   . Cholelithiasis with choledocholithiasis   . Liver lesion   . Atherosclerosis   . Diverticulosis   . Sepsis [facility])    Past Surgical History  Procedure Laterality Date  . Colonoscopy     Social History:  has no tobacco, alcohol, and drug history on file. he is married lives at home with his wife he is a retired Information systems manager. He is actively plays golf [place] in [place] in his community  No Known Allergies  No family history on file. family medical history reviewed and noncontributory to the admission of this elderly gentleman  Prior to Admission medications   Not on File   Physical Exam: Filed Vitals:   11/04/14 1216 11/04/14 1333  BP: 115/61 123/65  Pulse: 97 89  Temp: 99.1 F (37.3 C) 98.5 F (36.9 C)  TempSrc: Oral Oral  Resp: 18 16  Height: 6' (1.829 m)   Weight: 87 kg (191 lb 12.8 oz)   SpO2: 97% 98%    Wt Readings from Last 3 Encounters:  11/04/14 87 kg (191 lb 12.8 oz)  11/04/14 85.14 kg (187 lb 11.2 oz)    General:  Appears calm  and comfortable, slightly pale slightly lethargic Eyes: PERRL, normal lids, irises & conjunctiva ENT: grossly normal hearing, his membranes of his mouth slightly dry slightly pale Neck: no LAD, masses or thyromegaly Cardiovascular: Tachycardic but regular no m/r/g. No LE edema.  Respiratory: CTA bilaterally, no w/r/r. Rest sounds slightly diminished in bases Normal respiratory effort. Abdomen: soft,  nondistended nontender to palpation positive bowel sounds no guarding no organomegaly noted Skin: no rash or induration seen on limited exam Musculoskeletal: grossly normal tone BUE/BLE Psychiatric: grossly normal mood and affect, speech fluent and appropriate Neurologic: grossly non-focal. Speech slow but deliberate and clear follows commands           Labs on Admission:  Basic Metabolic Panel:  Recent Labs Lab 11/04/14 0623  NA 139  K 2.8*  CL 100*  CO2 23  GLUCOSE 147*  BUN 13  CREATININE 1.08  CALCIUM 9.0   Liver Function Tests:  Recent Labs Lab 11/04/14 0623  AST 85*  ALT 188*  ALKPHOS 180*  BILITOT 1.3*  PROT 7.0  ALBUMIN 3.8   No results for input(s): LIPASE, AMYLASE in the last 168 hours. No results for input(s): AMMONIA in the last 168 hours. CBC:  Recent Labs Lab 11/04/14 0623  WBC 12.9*  NEUTROABS 12.3*  HGB 15.0  HCT 44.4  MCV 93.4  PLT 152   Cardiac Enzymes:  Recent Labs Lab 11/04/14 0623  TROPONINI <0.03    BNP (last 3 results) No results for input(s): BNP in the last 8760 hours.  ProBNP (last 3 results) No results for input(s): PROBNP in the last 8760 hours.  CBG: No results for input(s): GLUCAP in the last 168 hours.  Radiological Exams on Admission: Ct Head Wo Contrast  11/04/2014  CLINICAL DATA:  69 year old male found at 4 a.m. to be diaphoretic and unresponsive. Previously normal mental status. Febrile with temperature of 102.8 degrees. EXAM: CT HEAD WITHOUT CONTRAST TECHNIQUE: Contiguous axial images were obtained from the base of the skull through the vertex without intravenous contrast. COMPARISON:  No priors. FINDINGS: Patchy areas of mild decreased attenuation are noted throughout the deep and periventricular white matter of the cerebral hemispheres bilaterally, compatible with mild chronic microvascular ischemic disease. No acute intracranial abnormalities. Specifically, no evidence of acute intracranial hemorrhage, no  definite findings of acute/subacute cerebral ischemia, no mass, mass effect, hydrocephalus or abnormal intra or extra-axial fluid collections. Visualized paranasal sinuses and mastoids are well pneumatized. No acute displaced skull fractures are identified. IMPRESSION: 1. No acute intracranial abnormalities. 2. Mild chronic microvascular ischemic changes in cerebral white matter, as above. Electronically Signed   By: Trudie Reed M.D.   On: 11/04/2014 07:58   Ct Chest Wo Contrast  11/04/2014  CLINICAL DATA:  69 year old male reportedly at baseline mental status yesterday evening found unresponsive and diaphoretic at 4 a.m. this morning. Febrile with axillary temperature of 102.8 degrees. Altered mental status. EXAM: CT CHEST WITHOUT CONTRAST, AND CTA ABDOMEN AND PELVIS WITH CONTRAST TECHNIQUE: Multidetector CT imaging of the chest was performed following the standard protocol without IV contrast. Multidetector CT imaging of the abdomen and pelvis was performed using the standard protocol during bolus administration of intravenous contrast. Multiplanar reconstructed images and MIPs were obtained and reviewed to evaluate the vascular anatomy. CONTRAST:  OMNIPAQUE IOHEXOL 350 MG/ML SOLN COMPARISON:  No priors. FINDINGS: CT CHEST FINDINGS Mediastinum/Lymph Nodes: Heart size is normal. There is no significant pericardial fluid, thickening or pericardial calcification. There is atherosclerosis of the thoracic aorta, the great vessels  of the mediastinum and the coronary arteries, including calcified atherosclerotic plaque in the left main, left anterior descending, left circumflex and right coronary arteries. Calcifications of the aortic valve. No pathologically enlarged mediastinal or hilar lymph nodes. Please note that accurate exclusion of hilar adenopathy is limited on noncontrast CT scans. Esophagus is unremarkable in appearance. No axillary lymphadenopathy. Lungs/Pleura: Dependent atelectasis in the  lower lobes of the lungs bilaterally. No acute consolidative airspace disease. No pleural effusions. No suspicious appearing pulmonary nodules or masses. Musculoskeletal/Soft Tissues: There are no aggressive appearing lytic or blastic lesions noted in the visualized portions of the skeleton. CTA ABDOMEN AND PELVIS FINDINGS Hepatobiliary: Mild diffuse decreased attenuation throughout the hepatic parenchyma. Multiple small calcified granulomas in the liver incidentally noted. Somewhat ill-defined ovoid shaped 1.5 x 1.0 cm hypovascular lesion between segments 5 and 6 in the liver (image 24 of series 6) is indeterminate. No other suspicious hepatic lesions are noted. Very mild periportal edema. No definite intra hepatic biliary ductal dilatation. There are some tiny high attenuation foci lying dependently in the neck of the gallbladder. In addition, in the mid common bile ducts (image 32 of series 6 and coronal image 61 of series 12) there is a 5 x 8 x 10 mm high attenuation focus, presumably a ductal stone. Proximal to this stone the common bile duct is normal in caliber measuring 7 mm in the porta hepatis. Distally, the duct dilates up to 9 mm just before the ampulla. Pancreas: No pancreatic ductal dilatation. No pancreatic mass. No pancreatic or peripancreatic fluid or inflammatory changes. Spleen: Unremarkable. Adrenals/Urinary Tract: Sub cm low-attenuation lesion in the lower pole of the left kidney is too small to characterize, but favored to represent a cyst. Right kidney is normal in appearance. Bilateral perinephric stranding (nonspecific). No hydroureteronephrosis. Urinary bladder is normal in appearance. Bilateral adrenal glands are normal in appearance. Stomach/Bowel: Normal appearance of the stomach. No pathologic dilatation of small bowel or colon. Numerous colonic diverticulae are noted, particularly in the sigmoid colon, without surrounding inflammatory changes to suggest an acute diverticulitis at this  time. Normal appendix. Vascular/Lymphatic: Atherosclerosis throughout the abdominal and pelvic vasculature, without evidence of aneurysm or dissection. Celiac axis, superior mesenteric artery and inferior mesenteric artery and their major branches are all widely patent without definite hemodynamic significant stenosis. Single renal arteries bilaterally. No lymphadenopathy noted in the abdomen or pelvis. Reproductive: Prostate gland seminal vesicles are unremarkable in appearance. Other: No significant volume of ascites.  No pneumoperitoneum. Musculoskeletal: There are no aggressive appearing lytic or blastic lesions noted in the visualized portions of the skeleton. Review of the MIP images confirms the above findings. IMPRESSION: 1. Cholelithiasis and choledocholithiasis. No overt signs of biliary tract obstruction at this time, although it is unusual that the very distal common bile duct immediately before the ampulla is mildly dilated. There is some mild periportal edema in the right lobe of the liver which is nonspecific, but correlation with liver function tests is recommended. 2. Ill-defined 1.5 x 1.0 cm hypovascular lesion between segments 5 and 6 in the right lobe of the liver. This is nonspecific, and would require further evaluation with nonemergent MRI with and without IV gadolinium at some point in near future for more definitive characterization. 3. No acute findings in the thorax. 4. Atherosclerosis, including left main and 3 vessel coronary artery disease. Please note that although the presence of coronary artery calcium documents the presence of coronary artery disease, the severity of this disease and any potential stenosis  cannot be assessed on this non-gated CT examination. Assessment for potential risk factor modification, dietary therapy or pharmacologic therapy may be warranted, if clinically indicated. 5. Colonic diverticulosis without evidence of acute diverticulitis at this time. 6.  Additional incidental findings, as above. Electronically Signed   By: Trudie Reedaniel  Entrikin M.D.   On: 11/04/2014 08:35   Dg Chest Port 1 View  11/04/2014  CLINICAL DATA:  Patient was found unresponsive at 6 a.m. today. Fever. History of hypertension. EXAM: PORTABLE CHEST 1 VIEW COMPARISON:  None. FINDINGS: Shallow inspiration with atelectasis in the lung bases. Heart size and pulmonary vascularity are normal. No focal consolidation in the lungs. No blunting of costophrenic angles. No pneumothorax. Mediastinal contours appear intact. IMPRESSION: Shallow inspiration with atelectasis in the lung bases. Electronically Signed   By: Burman NievesWilliam  Stevens M.D.   On: 11/04/2014 06:51   Ct Cta Abd/pel W/cm &/or W/o Cm  11/04/2014  CLINICAL DATA:  69 year old male reportedly at baseline mental status yesterday evening found unresponsive and diaphoretic at 4 a.m. this morning. Febrile with axillary temperature of 102.8 degrees. Altered mental status. EXAM: CT CHEST WITHOUT CONTRAST, AND CTA ABDOMEN AND PELVIS WITH CONTRAST TECHNIQUE: Multidetector CT imaging of the chest was performed following the standard protocol without IV contrast. Multidetector CT imaging of the abdomen and pelvis was performed using the standard protocol during bolus administration of intravenous contrast. Multiplanar reconstructed images and MIPs were obtained and reviewed to evaluate the vascular anatomy. CONTRAST:  100mL OMNIPAQUE IOHEXOL 350 MG/ML SOLN COMPARISON:  No priors. FINDINGS: CT CHEST FINDINGS Mediastinum/Lymph Nodes: Heart size is normal. There is no significant pericardial fluid, thickening or pericardial calcification. There is atherosclerosis of the thoracic aorta, the great vessels of the mediastinum and the coronary arteries, including calcified atherosclerotic plaque in the left main, left anterior descending, left circumflex and right coronary arteries. Calcifications of the aortic valve. No pathologically enlarged mediastinal or  hilar lymph nodes. Please note that accurate exclusion of hilar adenopathy is limited on noncontrast CT scans. Esophagus is unremarkable in appearance. No axillary lymphadenopathy. Lungs/Pleura: Dependent atelectasis in the lower lobes of the lungs bilaterally. No acute consolidative airspace disease. No pleural effusions. No suspicious appearing pulmonary nodules or masses. Musculoskeletal/Soft Tissues: There are no aggressive appearing lytic or blastic lesions noted in the visualized portions of the skeleton. CTA ABDOMEN AND PELVIS FINDINGS Hepatobiliary: Mild diffuse decreased attenuation throughout the hepatic parenchyma. Multiple small calcified granulomas in the liver incidentally noted. Somewhat ill-defined ovoid shaped 1.5 x 1.0 cm hypovascular lesion between segments 5 and 6 in the liver (image 24 of series 6) is indeterminate. No other suspicious hepatic lesions are noted. Very mild periportal edema. No definite intra hepatic biliary ductal dilatation. There are some tiny high attenuation foci lying dependently in the neck of the gallbladder. In addition, in the mid common bile ducts (image 32 of series 6 and coronal image 61 of series 12) there is a 5 x 8 x 10 mm high attenuation focus, presumably a ductal stone. Proximal to this stone the common bile duct is normal in caliber measuring 7 mm in the porta hepatis. Distally, the duct dilates up to 9 mm just before the ampulla. Pancreas: No pancreatic ductal dilatation. No pancreatic mass. No pancreatic or peripancreatic fluid or inflammatory changes. Spleen: Unremarkable. Adrenals/Urinary Tract: Sub cm low-attenuation lesion in the lower pole of the left kidney is too small to characterize, but favored to represent a cyst. Right kidney is normal in appearance. Bilateral perinephric stranding (nonspecific). No  hydroureteronephrosis. Urinary bladder is normal in appearance. Bilateral adrenal glands are normal in appearance. Stomach/Bowel: Normal appearance of  the stomach. No pathologic dilatation of small bowel or colon. Numerous colonic diverticulae are noted, particularly in the sigmoid colon, without surrounding inflammatory changes to suggest an acute diverticulitis at this time. Normal appendix. Vascular/Lymphatic: Atherosclerosis throughout the abdominal and pelvic vasculature, without evidence of aneurysm or dissection. Celiac axis, superior mesenteric artery and inferior mesenteric artery and their major branches are all widely patent without definite hemodynamic significant stenosis. Single renal arteries bilaterally. No lymphadenopathy noted in the abdomen or pelvis. Reproductive: Prostate gland seminal vesicles are unremarkable in appearance. Other: No significant volume of ascites.  No pneumoperitoneum. Musculoskeletal: There are no aggressive appearing lytic or blastic lesions noted in the visualized portions of the skeleton. Review of the MIP images confirms the above findings. IMPRESSION: 1. Cholelithiasis and choledocholithiasis. No overt signs of biliary tract obstruction at this time, although it is unusual that the very distal common bile duct immediately before the ampulla is mildly dilated. There is some mild periportal edema in the right lobe of the liver which is nonspecific, but correlation with liver function tests is recommended. 2. Ill-defined 1.5 x 1.0 cm hypovascular lesion between segments 5 and 6 in the right lobe of the liver. This is nonspecific, and would require further evaluation with nonemergent MRI with and without IV gadolinium at some point in near future for more definitive characterization. 3. No acute findings in the thorax. 4. Atherosclerosis, including left main and 3 vessel coronary artery disease. Please note that although the presence of coronary artery calcium documents the presence of coronary artery disease, the severity of this disease and any potential stenosis cannot be assessed on this non-gated CT examination.  Assessment for potential risk factor modification, dietary therapy or pharmacologic therapy may be warranted, if clinically indicated. 5. Colonic diverticulosis without evidence of acute diverticulitis at this time. 6. Additional incidental findings, as above. Electronically Signed   By: Trudie Reed M.D.   On: 11/04/2014 08:35    EKG: Pending  Assessment/Plan Principal Problem:   SIRS (systemic inflammatory response syndrome) (HCC): Sepsis called while patient was in the emergency department at [facility] she received 3 L of normal saline per protocol as well as vancomycin and Zosyn. Source remains unclear and concern incident results of CT of the abdomen. Will continue vigorous IV fluids. Will continue Vanco and Zosyn until source can be identified. Will repeat lactic acid however 3 hours after fluid resuscitation lactic acid was within normal limits. Tachycardia resolved max temp 99.1. Will hold his home antihypertensive medication as his blood pressure is stable but slightly soft Await GI consult  Active Problems:  Acute encephalopathy: Related to above. Much improved after fluid resuscitation. CT of the head with no acute abnormalities.    Lactic acidosis: See #1. Lactic acid level within the limits of normal 3 hours after fluid resuscitation per chart review. Will repeat    Cholelithiasis with choledocholithiasis: Per CT with no overt signs of biliary tract obstruction. Case discussed with Dr. Madilyn Fireman with [facility] gastroenterology who has agreed to consult. In the meantime we'll keep him nothing by mouth support with IV fluids. Patient denies abdominal pain nausea.    Elevated transaminase level: Likely related to above. Will repeat. CT also shows ill-defined hypovascular lesion right lobe of liver. Nonemergent MRI recommended per report.    Hypokalemia: Reportedly patient received IV potassium and route. Will recheck. If remains depleted will replete.  Hypertension:  Blood pressure control  but slightly soft. She reports being on antihypertensive medication but cannot remember which one. Will monitor blood pressure closely. Will request pharmacy medication reconciliation. Plan to hold any antihypertensives for now anyway.    Hyperlipemia: Will obtain a lipid panel.    Liver lesion: Incidental finding per CT. Outpatient follow-up    Coronary atherosclerosis: Per CT. Patient denies history of same. No chest pain. Outpatient follow-up      Dr Madilyn Fireman gastroenterology Code Status: full DVT Prophylaxis: Family Communication: none present Disposition Plan: home hopefully 24-48 hours  Time spent: 65 minutes  [facility]

## 2014-11-04 NOTE — Progress Notes (Signed)
Pt admitted to the unit at 1215. Pt mental status is alert, verbal, oriented x4. Pt oriented to room, staff, and call bell. Skin is dry/flakey but no breakdown. Full assessment charted in [facility]. Call bell within reach. Visitor guidelines reviewed w/ pt and/or family. No complaints at this time.

## 2014-11-04 NOTE — ED Notes (Signed)
Pt arrives to ED via [facility] d/t c/o unresponsiveness per pt's wife. Pt was alert and at baseline at 4am, but was found diaphoretic and unresponsive by his wife around [place]6am. Per EMS, pt had an axillary temp of 102.8  Upon their arrival to the residence.EMS reports the pt became responsive en route, cooling packs were applied to the groin and bilateral underarms. EMS placed bilateral AC 18g IVs, with 500 NS infusing upon arrival. Pt is alert and somewhat oriented. A small amount of dried blood is noted in the pt's mouth, which EMS reports may indicate a seizure.

## 2014-11-04 NOTE — Consult Note (Signed)
[facility] Consult Note  Referring Provider: No ref. provider found Primary Care Physician:  Dion Body, MD Primary Gastroenterologist:  Dr.  Laurel Dimmer Complaint: Weakness lethargy and abdominal pain HPI: Jesus White is an 69 y.o. white male  who presented to [facility] with abdominal pain last night followed by fever chills lethargy and disorientation with temperature of her 102. He had slightly elevated liver function tests, a leukocytosis and CT scan showing gallstones or common bile duct stone. He was started on antibiotics transferred here. His bilirubin is 1.5. He had an elevated lactic acid level of 5. He feels much better and is completely alert and oriented and pain-free at the moment.  Past Medical History  Diagnosis Date  . Hypertension   . Hyperlipemia   . Anxiety   . Cholelithiasis with choledocholithiasis   . Liver lesion   . Atherosclerosis   . Diverticulosis   . Sepsis [facility])     Past Surgical History  Procedure Laterality Date  . Colonoscopy      Medications Prior to Admission  Medication Sig Dispense Refill  . acetaminophen (TYLENOL) 500 MG tablet Take 1,000 mg by mouth every 8 (eight) hours as needed (pain).    Marland Kitchen amLODipine (NORVASC) 10 MG tablet Take 10 mg by mouth at bedtime.  1  . hydrochlorothiazide (HYDRODIURIL) 25 MG tablet Take 25 mg by mouth at bedtime.  1  . KRILL OIL PO Take 1 capsule by mouth daily.    Marland Kitchen losartan (COZAAR) 50 MG tablet Take 75 mg by mouth at bedtime.    . lovastatin (MEVACOR) 40 MG tablet Take 80 mg by mouth at bedtime.  1  . Multiple Vitamin (MULTIVITAMIN WITH MINERALS) TABS tablet Take 1 tablet by mouth daily.    Marland Kitchen omeprazole (PRILOSEC) 20 MG capsule Take 20 mg by mouth daily.  1  . PARoxetine (PAXIL) 20 MG tablet Take 20 mg by mouth at bedtime.      Allergies: No Known Allergies  History reviewed. No pertinent family history.  Social History:  reports that he has never smoked. He does not  have any smokeless tobacco history on file. His alcohol and drug histories are not on file.  Review of Systems: negative except as above   Blood pressure 123/65, pulse 89, temperature 98.5 F (36.9 C), temperature source Oral, resp. rate 16, height 6' (1.829 m), weight 87 kg (191 lb 12.8 oz), SpO2 98 %. Head: Normocephalic, without obvious abnormality, atraumatic Neck: no adenopathy, no carotid bruit, no JVD, supple, symmetrical, trachea midline and thyroid not enlarged, symmetric, no tenderness/mass/nodules Resp: clear to auscultation bilaterally Cardio: regular rate and rhythm, S1, S2 normal, no murmur, click, rub or gallop GI: Abdomen soft minimally tender with normoactive bowel sounds. No hepatosplenomegaly mass or guarding Extremities: extremities normal, atraumatic, no cyanosis or edema  Results for orders placed or performed during the hospital encounter of 11/04/14 (from the past 48 hour(s))  CBC with Differential     Status: Abnormal   Collection Time: 11/04/14  2:26 PM  Result Value Ref Range   WBC 17.5 (H) 4.0 - 10.5 K/uL   RBC 4.10 (L) 4.22 - 5.81 MIL/uL   Hemoglobin 13.1 13.0 - 17.0 g/dL   HCT 38.4 (L) 39.0 - 52.0 %   MCV 93.7 78.0 - 100.0 fL   MCH 32.0 26.0 - 34.0 pg   MCHC 34.1 30.0 - 36.0 g/dL   RDW 13.2 11.5 - 15.5 %   Platelets 140 (L) 150 - 400  K/uL   Neutrophils Relative % 92 %   Neutro Abs 16.0 (H) 1.7 - 7.7 K/uL   Lymphocytes Relative 3 %   Lymphs Abs 0.6 (L) 0.7 - 4.0 K/uL   Monocytes Relative 5 %   Monocytes Absolute 0.9 0.1 - 1.0 K/uL   Eosinophils Relative 0 %   Eosinophils Absolute 0.0 0.0 - 0.7 K/uL   Basophils Relative 0 %   Basophils Absolute 0.0 0.0 - 0.1 K/uL  Comprehensive metabolic panel     Status: Abnormal   Collection Time: 11/04/14  2:26 PM  Result Value Ref Range   Sodium 132 (L) 135 - 145 mmol/L   Potassium 3.3 (L) 3.5 - 5.1 mmol/L   Chloride 97 (L) 101 - 111 mmol/L   CO2 25 22 - 32 mmol/L   Glucose, Bld 161 (H) 65 - 99 mg/dL   BUN  7 6 - 20 mg/dL   Creatinine, Ser 0.83 0.61 - 1.24 mg/dL   Calcium 7.6 (L) 8.9 - 10.3 mg/dL   Total Protein 5.6 (L) 6.5 - 8.1 g/dL   Albumin 2.8 (L) 3.5 - 5.0 g/dL   AST 59 (H) 15 - 41 U/L   ALT 140 (H) 17 - 63 U/L   Alkaline Phosphatase 131 (H) 38 - 126 U/L   Total Bilirubin 1.3 (H) 0.3 - 1.2 mg/dL   GFR calc non Af Amer >60 >60 mL/min   GFR calc Af Amer >60 >60 mL/min    Comment: (NOTE) The eGFR has been calculated using the CKD EPI equation. This calculation has not been validated in all clinical situations. eGFR's persistently <60 mL/min signify possible Chronic Kidney Disease.    Anion gap 10 5 - 15  Procalcitonin     Status: None   Collection Time: 11/04/14  2:26 PM  Result Value Ref Range   Procalcitonin 8.51 ng/mL    Comment:        Interpretation: PCT > 2 ng/mL: Systemic infection (sepsis) is likely, unless other causes are known. (NOTE)         ICU PCT Algorithm               Non ICU PCT Algorithm    ----------------------------     ------------------------------         PCT < 0.25 ng/mL                 PCT < 0.1 ng/mL     Stopping of antibiotics            Stopping of antibiotics       strongly encouraged.               strongly encouraged.    ----------------------------     ------------------------------       PCT level decrease by               PCT < 0.25 ng/mL       >= 80% from peak PCT       OR PCT 0.25 - 0.5 ng/mL          Stopping of antibiotics                                             encouraged.     Stopping of antibiotics           encouraged.    ----------------------------     ------------------------------  PCT level decrease by              PCT >= 0.25 ng/mL       < 80% from peak PCT        AND PCT >= 0.5 ng/mL            Continuing antibiotics                                               encouraged.       Continuing antibiotics            encouraged.    ----------------------------     ------------------------------     PCT level  increase compared          PCT > 0.5 ng/mL         with peak PCT AND          PCT >= 0.5 ng/mL             Escalation of antibiotics                                          strongly encouraged.      Escalation of antibiotics        strongly encouraged.   Protime-INR     Status: None   Collection Time: 11/04/14  2:26 PM  Result Value Ref Range   Prothrombin Time 14.2 11.6 - 15.2 seconds   INR 1.08 0.00 - 1.49  APTT     Status: None   Collection Time: 11/04/14  2:26 PM  Result Value Ref Range   aPTT 29 24 - 37 seconds  Magnesium     Status: Abnormal   Collection Time: 11/04/14  2:26 PM  Result Value Ref Range   Magnesium 1.6 (L) 1.7 - 2.4 mg/dL  Lactic acid, plasma     Status: None   Collection Time: 11/04/14  2:26 PM  Result Value Ref Range   Lactic Acid, Venous 1.5 0.5 - 2.0 mmol/L   Ct Head Wo Contrast  11/04/2014  CLINICAL DATA:  69 year old male found at 4 a.m. to be diaphoretic and unresponsive. Previously normal mental status. Febrile with temperature of 102.8 degrees. EXAM: CT HEAD WITHOUT CONTRAST TECHNIQUE: Contiguous axial images were obtained from the base of the skull through the vertex without intravenous contrast. COMPARISON:  No priors. FINDINGS: Patchy areas of mild decreased attenuation are noted throughout the deep and periventricular white matter of the cerebral hemispheres bilaterally, compatible with mild chronic microvascular ischemic disease. No acute intracranial abnormalities. Specifically, no evidence of acute intracranial hemorrhage, no definite findings of acute/subacute cerebral ischemia, no mass, mass effect, hydrocephalus or abnormal intra or extra-axial fluid collections. Visualized paranasal sinuses and mastoids are well pneumatized. No acute displaced skull fractures are identified. IMPRESSION: 1. No acute intracranial abnormalities. 2. Mild chronic microvascular ischemic changes in cerebral white matter, as above. Electronically Signed   By: Vinnie Langton M.D.   On: 11/04/2014 07:58   Ct Chest Wo Contrast  11/04/2014  CLINICAL DATA:  69 year old male reportedly at baseline mental status yesterday evening found unresponsive and diaphoretic at 4 a.m. this morning. Febrile with axillary temperature of 102.8 degrees. Altered mental status. EXAM: CT CHEST WITHOUT CONTRAST, AND CTA ABDOMEN AND  PELVIS WITH CONTRAST TECHNIQUE: Multidetector CT imaging of the chest was performed following the standard protocol without IV contrast. Multidetector CT imaging of the abdomen and pelvis was performed using the standard protocol during bolus administration of intravenous contrast. Multiplanar reconstructed images and MIPs were obtained and reviewed to evaluate the vascular anatomy. CONTRAST:  169m OMNIPAQUE IOHEXOL 350 MG/ML SOLN COMPARISON:  No priors. FINDINGS: CT CHEST FINDINGS Mediastinum/Lymph Nodes: Heart size is normal. There is no significant pericardial fluid, thickening or pericardial calcification. There is atherosclerosis of the thoracic aorta, the great vessels of the mediastinum and the coronary arteries, including calcified atherosclerotic plaque in the left main, left anterior descending, left circumflex and right coronary arteries. Calcifications of the aortic valve. No pathologically enlarged mediastinal or hilar lymph nodes. Please note that accurate exclusion of hilar adenopathy is limited on noncontrast CT scans. Esophagus is unremarkable in appearance. No axillary lymphadenopathy. Lungs/Pleura: Dependent atelectasis in the lower lobes of the lungs bilaterally. No acute consolidative airspace disease. No pleural effusions. No suspicious appearing pulmonary nodules or masses. Musculoskeletal/Soft Tissues: There are no aggressive appearing lytic or blastic lesions noted in the visualized portions of the skeleton. CTA ABDOMEN AND PELVIS FINDINGS Hepatobiliary: Mild diffuse decreased attenuation throughout the hepatic parenchyma. Multiple small  calcified granulomas in the liver incidentally noted. Somewhat ill-defined ovoid shaped 1.5 x 1.0 cm hypovascular lesion between segments 5 and 6 in the liver (image 24 of series 6) is indeterminate. No other suspicious hepatic lesions are noted. Very mild periportal edema. No definite intra hepatic biliary ductal dilatation. There are some tiny high attenuation foci lying dependently in the neck of the gallbladder. In addition, in the mid common bile ducts (image 32 of series 6 and coronal image 61 of series 12) there is a 5 x 8 x 10 mm high attenuation focus, presumably a ductal stone. Proximal to this stone the common bile duct is normal in caliber measuring 7 mm in the porta hepatis. Distally, the duct dilates up to 9 mm just before the ampulla. Pancreas: No pancreatic ductal dilatation. No pancreatic mass. No pancreatic or peripancreatic fluid or inflammatory changes. Spleen: Unremarkable. Adrenals/Urinary Tract: Sub cm low-attenuation lesion in the lower pole of the left kidney is too small to characterize, but favored to represent a cyst. Right kidney is normal in appearance. Bilateral perinephric stranding (nonspecific). No hydroureteronephrosis. Urinary bladder is normal in appearance. Bilateral adrenal glands are normal in appearance. Stomach/Bowel: Normal appearance of the stomach. No pathologic dilatation of small bowel or colon. Numerous colonic diverticulae are noted, particularly in the sigmoid colon, without surrounding inflammatory changes to suggest an acute diverticulitis at this time. Normal appendix. Vascular/Lymphatic: Atherosclerosis throughout the abdominal and pelvic vasculature, without evidence of aneurysm or dissection. Celiac axis, superior mesenteric artery and inferior mesenteric artery and their major branches are all widely patent without definite hemodynamic significant stenosis. Single renal arteries bilaterally. No lymphadenopathy noted in the abdomen or pelvis. Reproductive:  Prostate gland seminal vesicles are unremarkable in appearance. Other: No significant volume of ascites.  No pneumoperitoneum. Musculoskeletal: There are no aggressive appearing lytic or blastic lesions noted in the visualized portions of the skeleton. Review of the MIP images confirms the above findings. IMPRESSION: 1. Cholelithiasis and choledocholithiasis. No overt signs of biliary tract obstruction at this time, although it is unusual that the very distal common bile duct immediately before the ampulla is mildly dilated. There is some mild periportal edema in the right lobe of the liver which is nonspecific, but correlation with  liver function tests is recommended. 2. Ill-defined 1.5 x 1.0 cm hypovascular lesion between segments 5 and 6 in the right lobe of the liver. This is nonspecific, and would require further evaluation with nonemergent MRI with and without IV gadolinium at some point in near future for more definitive characterization. 3. No acute findings in the thorax. 4. Atherosclerosis, including left main and 3 vessel coronary artery disease. Please note that although the presence of coronary artery calcium documents the presence of coronary artery disease, the severity of this disease and any potential stenosis cannot be assessed on this non-gated CT examination. Assessment for potential risk factor modification, dietary therapy or pharmacologic therapy may be warranted, if clinically indicated. 5. Colonic diverticulosis without evidence of acute diverticulitis at this time. 6. Additional incidental findings, as above. Electronically Signed   By: Vinnie Langton M.D.   On: 11/04/2014 08:35   Dg Chest Port 1 View  11/04/2014  CLINICAL DATA:  Patient was found unresponsive at 6 a.m. today. Fever. History of hypertension. EXAM: PORTABLE CHEST 1 VIEW COMPARISON:  None. FINDINGS: Shallow inspiration with atelectasis in the lung bases. Heart size and pulmonary vascularity are normal. No focal  consolidation in the lungs. No blunting of costophrenic angles. No pneumothorax. Mediastinal contours appear intact. IMPRESSION: Shallow inspiration with atelectasis in the lung bases. Electronically Signed   By: Lucienne Capers M.D.   On: 11/04/2014 06:51   Ct Cta Abd/pel W/cm &/or W/o Cm  11/04/2014  CLINICAL DATA:  69 year old male reportedly at baseline mental status yesterday evening found unresponsive and diaphoretic at 4 a.m. this morning. Febrile with axillary temperature of 102.8 degrees. Altered mental status. EXAM: CT CHEST WITHOUT CONTRAST, AND CTA ABDOMEN AND PELVIS WITH CONTRAST TECHNIQUE: Multidetector CT imaging of the chest was performed following the standard protocol without IV contrast. Multidetector CT imaging of the abdomen and pelvis was performed using the standard protocol during bolus administration of intravenous contrast. Multiplanar reconstructed images and MIPs were obtained and reviewed to evaluate the vascular anatomy. CONTRAST:  183m OMNIPAQUE IOHEXOL 350 MG/ML SOLN COMPARISON:  No priors. FINDINGS: CT CHEST FINDINGS Mediastinum/Lymph Nodes: Heart size is normal. There is no significant pericardial fluid, thickening or pericardial calcification. There is atherosclerosis of the thoracic aorta, the great vessels of the mediastinum and the coronary arteries, including calcified atherosclerotic plaque in the left main, left anterior descending, left circumflex and right coronary arteries. Calcifications of the aortic valve. No pathologically enlarged mediastinal or hilar lymph nodes. Please note that accurate exclusion of hilar adenopathy is limited on noncontrast CT scans. Esophagus is unremarkable in appearance. No axillary lymphadenopathy. Lungs/Pleura: Dependent atelectasis in the lower lobes of the lungs bilaterally. No acute consolidative airspace disease. No pleural effusions. No suspicious appearing pulmonary nodules or masses. Musculoskeletal/Soft Tissues: There are no  aggressive appearing lytic or blastic lesions noted in the visualized portions of the skeleton. CTA ABDOMEN AND PELVIS FINDINGS Hepatobiliary: Mild diffuse decreased attenuation throughout the hepatic parenchyma. Multiple small calcified granulomas in the liver incidentally noted. Somewhat ill-defined ovoid shaped 1.5 x 1.0 cm hypovascular lesion between segments 5 and 6 in the liver (image 24 of series 6) is indeterminate. No other suspicious hepatic lesions are noted. Very mild periportal edema. No definite intra hepatic biliary ductal dilatation. There are some tiny high attenuation foci lying dependently in the neck of the gallbladder. In addition, in the mid common bile ducts (image 32 of series 6 and coronal image 61 of series 12) there is a 5 x 8  x 10 mm high attenuation focus, presumably a ductal stone. Proximal to this stone the common bile duct is normal in caliber measuring 7 mm in the porta hepatis. Distally, the duct dilates up to 9 mm just before the ampulla. Pancreas: No pancreatic ductal dilatation. No pancreatic mass. No pancreatic or peripancreatic fluid or inflammatory changes. Spleen: Unremarkable. Adrenals/Urinary Tract: Sub cm low-attenuation lesion in the lower pole of the left kidney is too small to characterize, but favored to represent a cyst. Right kidney is normal in appearance. Bilateral perinephric stranding (nonspecific). No hydroureteronephrosis. Urinary bladder is normal in appearance. Bilateral adrenal glands are normal in appearance. Stomach/Bowel: Normal appearance of the stomach. No pathologic dilatation of small bowel or colon. Numerous colonic diverticulae are noted, particularly in the sigmoid colon, without surrounding inflammatory changes to suggest an acute diverticulitis at this time. Normal appendix. Vascular/Lymphatic: Atherosclerosis throughout the abdominal and pelvic vasculature, without evidence of aneurysm or dissection. Celiac axis, superior mesenteric artery and  inferior mesenteric artery and their major branches are all widely patent without definite hemodynamic significant stenosis. Single renal arteries bilaterally. No lymphadenopathy noted in the abdomen or pelvis. Reproductive: Prostate gland seminal vesicles are unremarkable in appearance. Other: No significant volume of ascites.  No pneumoperitoneum. Musculoskeletal: There are no aggressive appearing lytic or blastic lesions noted in the visualized portions of the skeleton. Review of the MIP images confirms the above findings. IMPRESSION: 1. Cholelithiasis and choledocholithiasis. No overt signs of biliary tract obstruction at this time, although it is unusual that the very distal common bile duct immediately before the ampulla is mildly dilated. There is some mild periportal edema in the right lobe of the liver which is nonspecific, but correlation with liver function tests is recommended. 2. Ill-defined 1.5 x 1.0 cm hypovascular lesion between segments 5 and 6 in the right lobe of the liver. This is nonspecific, and would require further evaluation with nonemergent MRI with and without IV gadolinium at some point in near future for more definitive characterization. 3. No acute findings in the thorax. 4. Atherosclerosis, including left main and 3 vessel coronary artery disease. Please note that although the presence of coronary artery calcium documents the presence of coronary artery disease, the severity of this disease and any potential stenosis cannot be assessed on this non-gated CT examination. Assessment for potential risk factor modification, dietary therapy or pharmacologic therapy may be warranted, if clinically indicated. 5. Colonic diverticulosis without evidence of acute diverticulitis at this time. 6. Additional incidental findings, as above. Electronically Signed   By: Vinnie Langton M.D.   On: 11/04/2014 08:35    Assessment: Gallstones, common bile duct stones and probable cholangitis. Plan:   Continue antibiotics. Plan ERCP tomorrow. Risks rationale and alternatives were discussed with the patient and his wife and they wish to proceed. Alba Perillo C 11/04/2014, 5:44 PM  Pager [phone] If no answer or after 5 PM call [phone]

## 2014-11-04 NOTE — Progress Notes (Signed)
ANTIBIOTIC CONSULT NOTE - INITIAL  Pharmacy Consult for vancomycin/zosyn Indication: rule out sepsis  No Known Allergies  Patient Measurements: Height: 6' (182.9 cm) Weight: 191 lb 12.8 oz (87 kg) IBW/kg (Calculated) : 77.6  Vital Signs: Temp: 99.1 F (37.3 C) (10/15 1216) Temp Source: Oral (10/15 1216) BP: 115/61 mmHg (10/15 1216) Pulse Rate: 97 (10/15 1216) Intake/Output from previous day:   Intake/Output from this shift:    Labs:  Recent Labs  11/04/14 0623  WBC 12.9*  HGB 15.0  PLT 152  CREATININE 1.08   Estimated Creatinine Clearance: 69.9 mL/min (by C-G formula based on Cr of 1.08). No results for input(s): VANCOTROUGH, VANCOPEAK, VANCORANDOM, GENTTROUGH, GENTPEAK, GENTRANDOM, TOBRATROUGH, TOBRAPEAK, TOBRARND, AMIKACINPEAK, AMIKACINTROU, AMIKACIN in the last 72 hours.   Microbiology: Recent Results (from the past 720 hour(s))  Blood Culture (routine x 2)     Status: None (Preliminary result)   Collection Time: 11/04/14  6:23 AM  Result Value Ref Range Status   Specimen Description BLOOD LEFT HAND  Final   Special Requests BOTTLES DRAWN AEROBIC AND ANAEROBIC 4CC  Final   Culture NO GROWTH < 12 HOURS  Final   Report Status PENDING  Incomplete  Blood Culture (routine x 2)     Status: None (Preliminary result)   Collection Time: 11/04/14  6:35 AM  Result Value Ref Range Status   Specimen Description BLOOD LEFT ARM  Final   Special Requests BOTTLES DRAWN AEROBIC AND ANAEROBIC 4CC  Final   Culture NO GROWTH < 12 HOURS  Final   Report Status PENDING  Incomplete    Medical History: Past Medical History  Diagnosis Date  . Hypertension   . Hyperlipemia   . Anxiety   . Cholelithiasis with choledocholithiasis   . Liver lesion   . Atherosclerosis   . Diverticulosis   . Sepsis [facility])     Assessment: 69yoM presented 11/04/2014 to [facility] with AMS, diaphoresis and unresponsive by wife around 0600. Temp by EMS 102.8, small amt of blood noted in pts mouth upon  arrival to ED (?seizure).   Pharmacy consulted for vancomycin/zosyn. Tmax 102.8, WBC 12.9, LA 6 (repeat 1.5). SCr 1.08  Goal of Therapy:  Vancomycin trough level 15-20 mcg/ml  Plan:  - Vancomycin 1g IV q12h - Zosyn 3.375g IV q8h - Follow-up C&S, CBC, clinical progression - Monitor SCr and vital signs  Belinda Fisheraylor P Brendan Gadson 11/04/2014,1:26 PM

## 2014-11-04 NOTE — ED Notes (Signed)
[facility] transport team arrived to the ED and stated that transportation for another assignment was cancelled and they were advised to [facility]. Patient will be going to 5W bed 34. Bedside report given to [facility].

## 2014-11-04 NOTE — ED Provider Notes (Signed)
-----------------------------------------   9:15 AM on 11/04/2014 -----------------------------------------  Care was assumed from Dr. Zenda AlpersWebster at approximately 7:45 AM pending CT abdomen and pelvis as well as CT chest which are concerning for choledocholithiasis with cholelithiasis. At this time the patient's mental status is improving, his eyes awaken to voice he follows all commands. Vital signs are stable. The case with Dr. Marva PandaSkulskie of GI who reports that the patient will need to be transferred as there is no one to consult/complete ERCP if needed today. The soonest that this can be performed at this hospital is Monday. My concern is that the patient needs immediate source control for fever/sepsis control so he will be transferred. We'll consult [facility].  ----------------------------------------- 10:17 AM on 11/04/2014 -----------------------------------------  Discussed with Dr. Madilyn FiremanHayes of [facility] who agrees with transfer. Discussed with Dr. Cena BentonVega of [facility] hospitalist who will accept the patient as transfer.  Gayla DossEryka A Jalani Rominger, MD 11/04/14 1245

## 2014-11-05 ENCOUNTER — Inpatient Hospital Stay (HOSPITAL_COMMUNITY): Payer: Medicare Other | Admitting: Anesthesiology

## 2014-11-05 ENCOUNTER — Inpatient Hospital Stay (HOSPITAL_COMMUNITY): Payer: Medicare Other

## 2014-11-05 ENCOUNTER — Encounter (HOSPITAL_COMMUNITY)
Admission: AD | Disposition: A | Discharge status: Home / Self Care | Payer: Self-pay | Source: Other Acute Inpatient Hospital | Attending: Internal Medicine

## 2014-11-05 ENCOUNTER — Encounter (HOSPITAL_COMMUNITY): Payer: Self-pay

## 2014-11-05 DIAGNOSIS — I10 Essential (primary) hypertension: Secondary | ICD-10-CM

## 2014-11-05 DIAGNOSIS — I2583 Coronary atherosclerosis due to lipid rich plaque: Secondary | ICD-10-CM

## 2014-11-05 DIAGNOSIS — R6510 Systemic inflammatory response syndrome (SIRS) of non-infectious origin without acute organ dysfunction: Secondary | ICD-10-CM

## 2014-11-05 DIAGNOSIS — E785 Hyperlipidemia, unspecified: Secondary | ICD-10-CM

## 2014-11-05 DIAGNOSIS — R740 Nonspecific elevation of levels of transaminase and lactic acid dehydrogenase [LDH]: Secondary | ICD-10-CM

## 2014-11-05 DIAGNOSIS — I2510 Atherosclerotic heart disease of native coronary artery without angina pectoris: Secondary | ICD-10-CM

## 2014-11-05 DIAGNOSIS — G9340 Encephalopathy, unspecified: Secondary | ICD-10-CM

## 2014-11-05 DIAGNOSIS — K8070 Calculus of gallbladder and bile duct without cholecystitis without obstruction: Secondary | ICD-10-CM

## 2014-11-05 HISTORY — PX: ERCP: SHX5425

## 2014-11-05 LAB — DIFFERENTIAL
BASOS PCT: 0 %
Basophils Absolute: 0 10*3/uL (ref 0.0–0.1)
EOS ABS: 0 10*3/uL (ref 0.0–0.7)
EOS PCT: 0 %
LYMPHS PCT: 8 %
Lymphs Abs: 1 10*3/uL (ref 0.7–4.0)
Monocytes Absolute: 0.6 10*3/uL (ref 0.1–1.0)
Monocytes Relative: 5 %
NEUTROS PCT: 86 %
Neutro Abs: 9.9 10*3/uL — ABNORMAL HIGH (ref 1.7–7.7)

## 2014-11-05 LAB — CBC
HEMATOCRIT: 39.1 % (ref 39.0–52.0)
HEMOGLOBIN: 12.7 g/dL — AB (ref 13.0–17.0)
MCH: 31.2 pg (ref 26.0–34.0)
MCHC: 32.5 g/dL (ref 30.0–36.0)
MCV: 96.1 fL (ref 78.0–100.0)
Platelets: 117 10*3/uL — ABNORMAL LOW (ref 150–400)
RBC: 4.07 MIL/uL — AB (ref 4.22–5.81)
RDW: 13.5 % (ref 11.5–15.5)
WBC: 11.5 10*3/uL — AB (ref 4.0–10.5)

## 2014-11-05 LAB — COMPREHENSIVE METABOLIC PANEL
ALBUMIN: 2.7 g/dL — AB (ref 3.5–5.0)
ALT: 107 U/L — ABNORMAL HIGH (ref 17–63)
ANION GAP: 8 (ref 5–15)
AST: 41 U/L (ref 15–41)
Alkaline Phosphatase: 113 U/L (ref 38–126)
BILIRUBIN TOTAL: 1.1 mg/dL (ref 0.3–1.2)
BUN: 6 mg/dL (ref 6–20)
CALCIUM: 7.6 mg/dL — AB (ref 8.9–10.3)
CO2: 27 mmol/L (ref 22–32)
Chloride: 100 mmol/L — ABNORMAL LOW (ref 101–111)
Creatinine, Ser: 0.87 mg/dL (ref 0.61–1.24)
GLUCOSE: 113 mg/dL — AB (ref 65–99)
POTASSIUM: 3.2 mmol/L — AB (ref 3.5–5.1)
Sodium: 135 mmol/L (ref 135–145)
TOTAL PROTEIN: 5.5 g/dL — AB (ref 6.5–8.1)

## 2014-11-05 LAB — MRSA PCR SCREENING: MRSA BY PCR: NEGATIVE

## 2014-11-05 SURGERY — ERCP, WITH INTERVENTION IF INDICATED
Anesthesia: General

## 2014-11-05 MED ORDER — PROPOFOL 10 MG/ML IV BOLUS
INTRAVENOUS | Status: DC | PRN
  Administered 2014-11-05: 150 mg via INTRAVENOUS

## 2014-11-05 MED ORDER — POTASSIUM CHLORIDE CRYS ER 20 MEQ PO TBCR
40.0000 meq | EXTENDED_RELEASE_TABLET | Freq: Once | ORAL | Status: AC
  Administered 2014-11-05: 40 meq via ORAL
  Filled 2014-11-05: qty 2

## 2014-11-05 MED ORDER — LIDOCAINE HCL (CARDIAC) 20 MG/ML IV SOLN
INTRAVENOUS | Status: DC | PRN
  Administered 2014-11-05: 100 mg via INTRAVENOUS

## 2014-11-05 MED ORDER — ONDANSETRON HCL 4 MG/2ML IJ SOLN: 4.0000 mg | Freq: Once | INTRAMUSCULAR | Status: DC | PRN

## 2014-11-05 MED ORDER — DEXTROSE 5 % IV SOLN
10.0000 mg | INTRAVENOUS | Status: DC | PRN
  Administered 2014-11-05: 60 ug/min via INTRAVENOUS

## 2014-11-05 MED ORDER — MIDAZOLAM HCL 2 MG/2ML IJ SOLN
INTRAMUSCULAR | Status: DC | PRN
  Administered 2014-11-05: 2 mg via INTRAVENOUS

## 2014-11-05 MED ORDER — SUCCINYLCHOLINE CHLORIDE 20 MG/ML IJ SOLN
INTRAMUSCULAR | Status: DC | PRN
  Administered 2014-11-05: 100 mg via INTRAVENOUS

## 2014-11-05 MED ORDER — ONDANSETRON HCL 4 MG/2ML IJ SOLN
INTRAMUSCULAR | Status: DC | PRN
  Administered 2014-11-05: 4 mg via INTRAVENOUS

## 2014-11-05 MED ORDER — GLUCAGON HCL RDNA (DIAGNOSTIC) 1 MG IJ SOLR
INTRAMUSCULAR | Status: AC
  Filled 2014-11-05: qty 1

## 2014-11-05 MED ORDER — FENTANYL CITRATE (PF) 250 MCG/5ML IJ SOLN
INTRAMUSCULAR | Status: AC
Start: 2014-11-05 — End: 2014-11-05
  Filled 2014-11-05: qty 5

## 2014-11-05 MED ORDER — MIDAZOLAM HCL 2 MG/2ML IJ SOLN
INTRAMUSCULAR | Status: AC
  Filled 2014-11-05: qty 4

## 2014-11-05 MED ORDER — FENTANYL CITRATE (PF) 100 MCG/2ML IJ SOLN: 25.0000 ug | INTRAMUSCULAR | Status: DC | PRN

## 2014-11-05 MED ORDER — LACTATED RINGERS IV SOLN
INTRAVENOUS | Status: DC | PRN
  Administered 2014-11-05: 09:00:00 via INTRAVENOUS

## 2014-11-05 NOTE — Anesthesia Preprocedure Evaluation (Addendum)
Anesthesia Evaluation  Patient identified by MRN, date of birth, ID band Patient awake    Reviewed: Allergy & Precautions, NPO status , Patient's Chart, lab work & pertinent test results  History of Anesthesia Complications Negative for: history of anesthetic complications  Airway Mallampati: III  TM Distance: >3 FB Neck ROM: Full    Dental  (+) Teeth Intact, Dental Advisory Given   Pulmonary neg pulmonary ROS,    Pulmonary exam normal breath sounds clear to auscultation       Cardiovascular hypertension, Pt. on medications (-) angina(-) Past MI Normal cardiovascular exam Rhythm:Regular Rate:Normal     Neuro/Psych PSYCHIATRIC DISORDERS Anxiety negative neurological ROS     GI/Hepatic Neg liver ROS, choledocholithiasis with cholelithiasis   Endo/Other  negative endocrine ROS  Renal/GU negative Renal ROS     Musculoskeletal negative musculoskeletal ROS (+)   Abdominal   Peds  Hematology  (+) Blood dyscrasia, anemia ,   Anesthesia Other Findings Day of surgery medications reviewed with the patient.  Reproductive/Obstetrics                          Anesthesia Physical Anesthesia Plan  ASA: II  Anesthesia Plan: General   Post-op Pain Management:    Induction: Intravenous  Airway Management Planned: Oral ETT  Additional Equipment:   Intra-op Plan:   Post-operative Plan: Extubation in OR  Informed Consent: I have reviewed the patients History and Physical, chart, labs and discussed the procedure including the risks, benefits and alternatives for the proposed anesthesia with the patient or authorized representative who has indicated his/her understanding and acceptance.   Dental advisory given  Plan Discussed with: CRNA  Anesthesia Plan Comments: (Risks/benefits of general anesthesia discussed with patient including risk of damage to teeth, lips, gum, and tongue, nausea/vomiting,  allergic reactions to medications, and the possibility of heart attack, stroke and death.  All patient questions answered.  Patient wishes to proceed.)        Anesthesia Quick Evaluation

## 2014-11-05 NOTE — Anesthesia Postprocedure Evaluation (Signed)
  Anesthesia Post-op Note  Patient: Jesus White  Procedure(s) Performed: Procedure(s) (LRB): ENDOSCOPIC RETROGRADE CHOLANGIOPANCREATOGRAPHY (ERCP) (N/A)  Patient Location: PACU  Anesthesia Type: General  Level of Consciousness: awake and alert   Airway and Oxygen Therapy: Patient Spontanous Breathing  Post-op Pain: mild  Post-op Assessment: Post-op Vital signs reviewed, Patient's Cardiovascular Status Stable, Respiratory Function Stable, Patent Airway and No signs of Nausea or vomiting  Last Vitals:  Filed Vitals:   11/05/14 1115  BP: 115/65  Pulse: 91  Temp: 37.2 C  Resp: 20    Post-op Vital Signs: stable   Complications: No apparent anesthesia complications

## 2014-11-05 NOTE — OR Nursing (Signed)
Pt. Temperature was 102.7 upon arrival to endoscopy pre-op.  Dr. Desmond Lopeurk notified.  Dr. Madilyn FiremanHayes notified upon arrival.  Orders given.  Will reassess pt. Upon completion of procedure.    Omelia BlackwaterShelby Kayelynn Abdou, RN

## 2014-11-05 NOTE — Anesthesia Procedure Notes (Signed)
Procedure Name: Intubation Date/Time: 11/05/2014 9:31 AM Performed by: Alanda AmassFRIEDMAN, Arria Naim A Pre-anesthesia Checklist: Patient identified, Emergency Drugs available, Suction available, Patient being monitored and Timeout performed Patient Re-evaluated:Patient Re-evaluated prior to inductionOxygen Delivery Method: Circle system utilized Preoxygenation: Pre-oxygenation with 100% oxygen Intubation Type: IV induction Laryngoscope Size: Glidescope Tube type: Oral Tube size: 7.5 mm Airway Equipment and Method: Stylet and Video-laryngoscopy Placement Confirmation: ETT inserted through vocal cords under direct vision,  positive ETCO2 and breath sounds checked- equal and bilateral Secured at: 23 cm Tube secured with: Tape Dental Injury: Teeth and Oropharynx as per pre-operative assessment

## 2014-11-05 NOTE — Progress Notes (Signed)
Utilization Review Completed.Jesus White T10/16/2016  

## 2014-11-05 NOTE — Op Note (Signed)
[facility] [address] [place], [id number]   ERCP PROCEDURE REPORT        EXAM DATE: 11/05/2014  PATIENT NAME:          Jesus White, Jesus White          MR #: [id number] BIRTHDATE:       11-Sep-1945     VISIT #:     [id number] ATTENDING:     Dorena CookeyJohn Marigene Erler, MD     STATUS:     inpatient ASSISTANT:      Nilsa NuttingAkande, Felicia and Omelia Blackwaterarpenter, Shelby   INDICATIONS:  The patient is a 69 yr old male here for an ERCP due to PROCEDURE PERFORMED:     ERCP with sphincterotomy and stone extraction. MEDICATIONS:     general anesthesia  CONSENT: The patient understands the risks and benefits of the procedure and understands that these risks include, but are not limited to: sedation, allergic reaction, infection, perforation and/or bleeding. Alternative means of evaluation and treatment include, among others: physical exam, x-rays, and/or surgical intervention. The patient elects to proceed with this endoscopic procedure.  DESCRIPTION OF PROCEDURE: During intra-op preparation period all mechanical & medical equipment was checked for proper function. Hand hygiene and appropriate measures for infection prevention was taken. After the risks, benefits and alternatives of the procedure were thoroughly explained, Informed was verified, confirmed and timeout was successfully executed by the treatment team. With the patient in left semi-prone position, medications were administered intravenously.The Pentax Ercp Scope [id number] was passed from the mouth into the esophagus and further advanced from the esophagus into the stomach. From stomach scope was directed to the the papilla of Vater     .  Major papilla was aligned with the duodenoscope. The scope position was confirmed fluoroscopically. Rest of the findings/therapeutics are given below. The scope was then completely withdrawn from the patient and the procedure completed. The pulse, BP, and O2 saturation were monitored and documented  by the physician and the nursing staff throughout the entire procedure. The patient was cared for as planned according to standard protocol. The patient was then discharged to recovery in stable condition and with appropriate post procedure care. Estimated blood loss is zero unless otherwise noted in this procedure report.  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 The papilla was located under a hooded fold distal to the diverticulum and cannulated with a guidewire and then the sphincterotome. A large sphincterotomy was made. Cholangiogram showed one single large distal ovoid stone proximally 8 x 4 mm. The pancreatic duct was not entered. A 12-15 mm balloon catheter was used to drag the stone out. There was good drainage of clear Amber bile at the end of the procedure. No other filling defects or abnormalities were seen on cholangiogram.    ADVERSE EVENT:     none immediate IMPRESSIONS:     single ovoid common bile duct stone removed after sphincterotomy.  RECOMMENDATIONS:     observe overnight for complications, continue antibiotics, surgical consult REPEAT EXAM:   ___________________________________ Dorena CookeyJohn Kemal Amores, MD eSigned:  Dorena CookeyJohn Jaxyn Rout, MD 11/05/2014 10:26 AM   cc:  CPT CODES: ICD9 CODES:  The ICD and CPT codes recommended by this software are interpretations from the data that the clinical staff has captured with the software.  The verification of the translation of this report to the ICD and CPT codes and modifiers is the sole responsibility of the health care institution and practicing physician where this report was  generated.  [employer] will not be held responsible for the validity of the ICD and CPT codes included on this report.  AMA assumes no liability for data contained or not contained herein. CPT is a Publishing rights manager of the [employer].   PATIENT NAME:  Jesus White, Jesus White MR#: [id number]

## 2014-11-05 NOTE — Progress Notes (Signed)
[facility]                                                                              Patient Demographics  Jesus White, is a 69 y.o. male, DOB - Sep 25, 1945, ONG:[id number]  Admit date - 11/04/2014   Admitting Physician Clydie Braun, MD  Outpatient Primary MD for the patient is Marisue Ivan, MD  LOS - 1   No chief complaint on file.      Brief HPI   Per Dr. Katrinka Blazing on 11/04/14 Jesus White is a 69 y.o. male with a past medical history that includes hypertension, hyperlipidemia, anxiety presents to room 34 on 5 W. at [facility] hospital from the emergency department at [facility] with fever, tachycardia and acute encephalopathy. Initial workup at [place] includes CT of the abdomen pelvis concerning for choledocholithiasis with cholelithiasis. He was transferred to [place] is GI services were not immediately available. History was obtained from the patient who reported that he was having chills, feeling "feverish" yesterday worsening in the evening to the point he could not "stop shaking" he reports his next memory is waking up in the [facility] emergency department. Chart indicates patient was brought to the hospital as his wife found him unresponsive early this morning. EMS reports when they arrived he had an axillary temp of 102.8. It is noted that he was given 500 mils of normal saline in and route he became more alert but remained confused. He was provided with cooling packs. Wife reported patient did complain of some abdominal pain the prior day. Wife indicated at 4am patient restless in bed and she believes he had emesis. No report of diarrhea. She did indicate he was not eating or drinking as he normally does. Upon arrival to [facility] ED, patient had fever, tachycardia and was confused. code sepsis was called patient was given 3 L of normal saline he was also provided with vancomycin and Zosyn for an unknown infection. Additionally he  received Tylenol rectally. Chest x-ray was unremarkable, urinalysis unremarkable, CT of the abdomen concerning for choledoholithiasis with cholelithiasis. Lactic acid 6.2 and within normal limits upon arrival to [facility]. Chart indicates gastroenterology not available so patient was transferred and Dr. Madilyn Fireman with [facility] gastroenterology will consult.    Assessment & Plan   Principal Problem:  SIRS (systemic inflammatory response syndrome) (HCC):  - CT abdomen showed cholelithiasis and choledocholithiasis, ill-defined 1.5 x 1 cm hypovascular lesion in the right lobe of the liver, recommended nonemergent MRI  - Gastroenterology consulted, ERCP today, continue IV fluid hydration - Continue IV broad-spectrum antibiotics, follow urine culture, blood cultures  Active Problems: Acute encephalopathy: Related to above, currently at baseline. Much improved after fluid resuscitation. CT of the head with no acute abnormalities.   Lactic acidosis: Secondary to #1 - Lactic acid level within the limits of normal 3 hours after fluid resuscitation  Transaminitis - Improving, ERCP today   Hypokalemia: - Replaced  Hypertension: - Currently stable monitor closely   Hyperlipemia: No statins can do to transaminitis   Liver lesion: Incidental finding per CT. Outpatient follow-up with MRI   Coronary atherosclerosis: Per CT.  Patient denies history of same. No chest pain. Outpatient follow-up   Code Status: Full code  Family Communication: Discussed in detail with the patient, all imaging results, lab results explained to the patient    Disposition Plan: Hopefully be seen in 24-48 hours  Time Spent in minutes   25 minutes  Procedures  ERCP  Consults   Gastroenterology  DVT Prophylaxis Lovenox  Medications  Scheduled Meds: . antiseptic oral rinse  7 mL Mouth Rinse BID  . enoxaparin (LOVENOX) injection  40 mg Subcutaneous Q24H  . piperacillin-tazobactam (ZOSYN)  IV  3.375 g  Intravenous 3 times per day  . sodium chloride  3 mL Intravenous Q12H  . vancomycin  1,000 mg Intravenous Q12H   Continuous Infusions: . sodium chloride 125 mL/hr at 11/05/14 1122   PRN Meds:.acetaminophen **OR** acetaminophen, HYDROmorphone (DILAUDID) injection, ondansetron **OR** ondansetron (ZOFRAN) IV   Antibiotics   Anti-infectives    Start     Dose/Rate Route Frequency Ordered Stop   11/05/14 0300  vancomycin (VANCOCIN) IVPB 1000 mg/200 mL premix     1,000 mg 200 mL/hr over 60 Minutes Intravenous Every 12 hours 11/04/14 2209     11/04/14 1400  piperacillin-tazobactam (ZOSYN) IVPB 3.375 g     3.375 g 12.5 mL/hr over 240 Minutes Intravenous 3 times per day 11/04/14 1323     11/04/14 1330  piperacillin-tazobactam (ZOSYN) IVPB 3.375 g  Status:  Discontinued     3.375 g 100 mL/hr over 30 Minutes Intravenous  Once 11/04/14 1304 11/04/14 1323   11/04/14 1330  vancomycin (VANCOCIN) IVPB 1000 mg/200 mL premix     1,000 mg 200 mL/hr over 60 Minutes Intravenous  Once 11/04/14 1304 11/04/14 1459        Subjective:   Jesus White was seen and examined today.  Patient denies dizziness, chest pain, shortness of breath,  new weakness, numbess, tingling. No acute events overnight.  Abdomen sore but not really tender. Overnight febrile.  Objective:   Blood pressure 115/65, pulse 91, temperature 98.9 F (37.2 C), temperature source Oral, resp. rate 20, height 6' (1.829 m), weight 87 kg (191 lb 12.8 oz), SpO2 96 %.  Wt Readings from Last 3 Encounters:  11/04/14 87 kg (191 lb 12.8 oz)  11/04/14 85.14 kg (187 lb 11.2 oz)     Intake/Output Summary (Last 24 hours) at 11/05/14 1203 Last data filed at 11/05/14 1100  Gross per 24 hour  Intake 1071.75 ml  Output   1350 ml  Net -278.25 ml    Exam  General: Alert and oriented x 3, NAD  HEENT:  PERRLA, EOMI, Anicteric Sclera, mucous membranes moist.   Neck: Supple, no JVD, no masses  CVS: S1 S2 auscultated, no rubs, murmurs or  gallops. Regular rate and rhythm.  Respiratory: Clear to auscultation bilaterally, no wheezing, rales or rhonchi  Abdomen: Soft, nontender, nondistended, + bowel sounds  Ext: no cyanosis clubbing or edema  Neuro: AAOx3, Cr N's II- XII. Strength 5/5 upper and lower extremities bilaterally  Skin: No rashes  Psych: Normal affect and demeanor, alert and oriented x3    Data Review   Micro Results Recent Results (from the past 240 hour(s))  Blood Culture (routine x 2)     Status: None (Preliminary result)   Collection Time: 11/04/14  6:23 AM  Result Value Ref Range Status   Specimen Description BLOOD LEFT HAND  Final   Special Requests BOTTLES DRAWN AEROBIC AND ANAEROBIC 4CC  Final   Culture NO GROWTH <  12 HOURS  Final   Report Status PENDING  Incomplete  Blood Culture (routine x 2)     Status: None (Preliminary result)   Collection Time: 11/04/14  6:35 AM  Result Value Ref Range Status   Specimen Description BLOOD LEFT ARM  Final   Special Requests BOTTLES DRAWN AEROBIC AND ANAEROBIC 4CC  Final   Culture NO GROWTH < 12 HOURS  Final   Report Status PENDING  Incomplete  Urine culture     Status: None (Preliminary result)   Collection Time: 11/04/14  6:50 AM  Result Value Ref Range Status   Specimen Description URINE, RANDOM  Final   Special Requests NONE  Final   Culture NO GROWTH < 24 HOURS  Final   Report Status PENDING  Incomplete  MRSA PCR Screening     Status: None   Collection Time: 11/05/14  3:30 AM  Result Value Ref Range Status   MRSA by PCR NEGATIVE NEGATIVE Final    Comment:        The GeneXpert MRSA Assay (FDA approved for NASAL specimens only), is one component of a comprehensive MRSA colonization surveillance program. It is not intended to diagnose MRSA infection nor to guide or monitor treatment for MRSA infections.     Radiology Reports Ct Head Wo Contrast  11/04/2014  CLINICAL DATA:  69 year old male found at 4 a.m. to be diaphoretic and  unresponsive. Previously normal mental status. Febrile with temperature of 102.8 degrees. EXAM: CT HEAD WITHOUT CONTRAST TECHNIQUE: Contiguous axial images were obtained from the base of the skull through the vertex without intravenous contrast. COMPARISON:  No priors. FINDINGS: Patchy areas of mild decreased attenuation are noted throughout the deep and periventricular white matter of the cerebral hemispheres bilaterally, compatible with mild chronic microvascular ischemic disease. No acute intracranial abnormalities. Specifically, no evidence of acute intracranial hemorrhage, no definite findings of acute/subacute cerebral ischemia, no mass, mass effect, hydrocephalus or abnormal intra or extra-axial fluid collections. Visualized paranasal sinuses and mastoids are well pneumatized. No acute displaced skull fractures are identified. IMPRESSION: 1. No acute intracranial abnormalities. 2. Mild chronic microvascular ischemic changes in cerebral white matter, as above. Electronically Signed   By: Trudie Reed M.D.   On: 11/04/2014 07:58   Ct Chest Wo Contrast  11/04/2014  CLINICAL DATA:  69 year old male reportedly at baseline mental status yesterday evening found unresponsive and diaphoretic at 4 a.m. this morning. Febrile with axillary temperature of 102.8 degrees. Altered mental status. EXAM: CT CHEST WITHOUT CONTRAST, AND CTA ABDOMEN AND PELVIS WITH CONTRAST TECHNIQUE: Multidetector CT imaging of the chest was performed following the standard protocol without IV contrast. Multidetector CT imaging of the abdomen and pelvis was performed using the standard protocol during bolus administration of intravenous contrast. Multiplanar reconstructed images and MIPs were obtained and reviewed to evaluate the vascular anatomy. CONTRAST:  OMNIPAQUE IOHEXOL 350 MG/ML SOLN COMPARISON:  No priors. FINDINGS: CT CHEST FINDINGS Mediastinum/Lymph Nodes: Heart size is normal. There is no significant pericardial fluid,  thickening or pericardial calcification. There is atherosclerosis of the thoracic aorta, the great vessels of the mediastinum and the coronary arteries, including calcified atherosclerotic plaque in the left main, left anterior descending, left circumflex and right coronary arteries. Calcifications of the aortic valve. No pathologically enlarged mediastinal or hilar lymph nodes. Please note that accurate exclusion of hilar adenopathy is limited on noncontrast CT scans. Esophagus is unremarkable in appearance. No axillary lymphadenopathy. Lungs/Pleura: Dependent atelectasis in the lower lobes of the lungs bilaterally. No  acute consolidative airspace disease. No pleural effusions. No suspicious appearing pulmonary nodules or masses. Musculoskeletal/Soft Tissues: There are no aggressive appearing lytic or blastic lesions noted in the visualized portions of the skeleton. CTA ABDOMEN AND PELVIS FINDINGS Hepatobiliary: Mild diffuse decreased attenuation throughout the hepatic parenchyma. Multiple small calcified granulomas in the liver incidentally noted. Somewhat ill-defined ovoid shaped 1.5 x 1.0 cm hypovascular lesion between segments 5 and 6 in the liver (image 24 of series 6) is indeterminate. No other suspicious hepatic lesions are noted. Very mild periportal edema. No definite intra hepatic biliary ductal dilatation. There are some tiny high attenuation foci lying dependently in the neck of the gallbladder. In addition, in the mid common bile ducts (image 32 of series 6 and coronal image 61 of series 12) there is a 5 x 8 x 10 mm high attenuation focus, presumably a ductal stone. Proximal to this stone the common bile duct is normal in caliber measuring 7 mm in the porta hepatis. Distally, the duct dilates up to 9 mm just before the ampulla. Pancreas: No pancreatic ductal dilatation. No pancreatic mass. No pancreatic or peripancreatic fluid or inflammatory changes. Spleen: Unremarkable. Adrenals/Urinary Tract: Sub  cm low-attenuation lesion in the lower pole of the left kidney is too small to characterize, but favored to represent a cyst. Right kidney is normal in appearance. Bilateral perinephric stranding (nonspecific). No hydroureteronephrosis. Urinary bladder is normal in appearance. Bilateral adrenal glands are normal in appearance. Stomach/Bowel: Normal appearance of the stomach. No pathologic dilatation of small bowel or colon. Numerous colonic diverticulae are noted, particularly in the sigmoid colon, without surrounding inflammatory changes to suggest an acute diverticulitis at this time. Normal appendix. Vascular/Lymphatic: Atherosclerosis throughout the abdominal and pelvic vasculature, without evidence of aneurysm or dissection. Celiac axis, superior mesenteric artery and inferior mesenteric artery and their major branches are all widely patent without definite hemodynamic significant stenosis. Single renal arteries bilaterally. No lymphadenopathy noted in the abdomen or pelvis. Reproductive: Prostate gland seminal vesicles are unremarkable in appearance. Other: No significant volume of ascites.  No pneumoperitoneum. Musculoskeletal: There are no aggressive appearing lytic or blastic lesions noted in the visualized portions of the skeleton. Review of the MIP images confirms the above findings. IMPRESSION: 1. Cholelithiasis and choledocholithiasis. No overt signs of biliary tract obstruction at this time, although it is unusual that the very distal common bile duct immediately before the ampulla is mildly dilated. There is some mild periportal edema in the right lobe of the liver which is nonspecific, but correlation with liver function tests is recommended. 2. Ill-defined 1.5 x 1.0 cm hypovascular lesion between segments 5 and 6 in the right lobe of the liver. This is nonspecific, and would require further evaluation with nonemergent MRI with and without IV gadolinium at some point in near future for more  definitive characterization. 3. No acute findings in the thorax. 4. Atherosclerosis, including left main and 3 vessel coronary artery disease. Please note that although the presence of coronary artery calcium documents the presence of coronary artery disease, the severity of this disease and any potential stenosis cannot be assessed on this non-gated CT examination. Assessment for potential risk factor modification, dietary therapy or pharmacologic therapy may be warranted, if clinically indicated. 5. Colonic diverticulosis without evidence of acute diverticulitis at this time. 6. Additional incidental findings, as above. Electronically Signed   By: Trudie Reed M.D.   On: 11/04/2014 08:35   Dg Chest Port 1 View  11/04/2014  CLINICAL DATA:  Patient was  found unresponsive at 6 a.m. today. Fever. History of hypertension. EXAM: PORTABLE CHEST 1 VIEW COMPARISON:  None. FINDINGS: Shallow inspiration with atelectasis in the lung bases. Heart size and pulmonary vascularity are normal. No focal consolidation in the lungs. No blunting of costophrenic angles. No pneumothorax. Mediastinal contours appear intact. IMPRESSION: Shallow inspiration with atelectasis in the lung bases. Electronically Signed   By: Burman Nieves M.D.   On: 11/04/2014 06:51   Dg Ercp Biliary & Pancreatic Ducts  11/05/2014  CLINICAL DATA:  Cholelithiasis and choledocholithiasis. Elevated liver function tests. Fever. EXAM: ERCP TECHNIQUE: Multiple spot images obtained with the fluoroscopic device and submitted for interpretation post-procedure. FLUOROSCOPY TIME:  Fluoroscopy Time:  1 minutes 52 seconds Number of Acquired Images:  1 COMPARISON:  None. FINDINGS: Single fluoroscopic image from an ERCP shows contrast opacification and guidewire within the common bile and common hepatic ducts. At least 2 rounded filling defects are seen within the distal common bile duct, consistent with choledocholithiasis. Mild dilatation of the distal common  bile duct is demonstrated. IMPRESSION: Mild dilatation of distal common bile duct, with at least 2 distal common duct calculi. These images were submitted for radiologic interpretation only. Please see the procedural report for the amount of contrast and the fluoroscopy time utilized. Electronically Signed   By: Myles Rosenthal M.D.   On: 11/05/2014 10:54   Ct Cta Abd/pel W/cm &/or W/o Cm  11/04/2014  CLINICAL DATA:  69 year old male reportedly at baseline mental status yesterday evening found unresponsive and diaphoretic at 4 a.m. this morning. Febrile with axillary temperature of 102.8 degrees. Altered mental status. EXAM: CT CHEST WITHOUT CONTRAST, AND CTA ABDOMEN AND PELVIS WITH CONTRAST TECHNIQUE: Multidetector CT imaging of the chest was performed following the standard protocol without IV contrast. Multidetector CT imaging of the abdomen and pelvis was performed using the standard protocol during bolus administration of intravenous contrast. Multiplanar reconstructed images and MIPs were obtained and reviewed to evaluate the vascular anatomy. CONTRAST:  OMNIPAQUE IOHEXOL 350 MG/ML SOLN COMPARISON:  No priors. FINDINGS: CT CHEST FINDINGS Mediastinum/Lymph Nodes: Heart size is normal. There is no significant pericardial fluid, thickening or pericardial calcification. There is atherosclerosis of the thoracic aorta, the great vessels of the mediastinum and the coronary arteries, including calcified atherosclerotic plaque in the left main, left anterior descending, left circumflex and right coronary arteries. Calcifications of the aortic valve. No pathologically enlarged mediastinal or hilar lymph nodes. Please note that accurate exclusion of hilar adenopathy is limited on noncontrast CT scans. Esophagus is unremarkable in appearance. No axillary lymphadenopathy. Lungs/Pleura: Dependent atelectasis in the lower lobes of the lungs bilaterally. No acute consolidative airspace disease. No pleural effusions. No  suspicious appearing pulmonary nodules or masses. Musculoskeletal/Soft Tissues: There are no aggressive appearing lytic or blastic lesions noted in the visualized portions of the skeleton. CTA ABDOMEN AND PELVIS FINDINGS Hepatobiliary: Mild diffuse decreased attenuation throughout the hepatic parenchyma. Multiple small calcified granulomas in the liver incidentally noted. Somewhat ill-defined ovoid shaped 1.5 x 1.0 cm hypovascular lesion between segments 5 and 6 in the liver (image 24 of series 6) is indeterminate. No other suspicious hepatic lesions are noted. Very mild periportal edema. No definite intra hepatic biliary ductal dilatation. There are some tiny high attenuation foci lying dependently in the neck of the gallbladder. In addition, in the mid common bile ducts (image 32 of series 6 and coronal image 61 of series 12) there is a 5 x 8 x 10 mm high attenuation focus, presumably a ductal  stone. Proximal to this stone the common bile duct is normal in caliber measuring 7 mm in the porta hepatis. Distally, the duct dilates up to 9 mm just before the ampulla. Pancreas: No pancreatic ductal dilatation. No pancreatic mass. No pancreatic or peripancreatic fluid or inflammatory changes. Spleen: Unremarkable. Adrenals/Urinary Tract: Sub cm low-attenuation lesion in the lower pole of the left kidney is too small to characterize, but favored to represent a cyst. Right kidney is normal in appearance. Bilateral perinephric stranding (nonspecific). No hydroureteronephrosis. Urinary bladder is normal in appearance. Bilateral adrenal glands are normal in appearance. Stomach/Bowel: Normal appearance of the stomach. No pathologic dilatation of small bowel or colon. Numerous colonic diverticulae are noted, particularly in the sigmoid colon, without surrounding inflammatory changes to suggest an acute diverticulitis at this time. Normal appendix. Vascular/Lymphatic: Atherosclerosis throughout the abdominal and pelvic  vasculature, without evidence of aneurysm or dissection. Celiac axis, superior mesenteric artery and inferior mesenteric artery and their major branches are all widely patent without definite hemodynamic significant stenosis. Single renal arteries bilaterally. No lymphadenopathy noted in the abdomen or pelvis. Reproductive: Prostate gland seminal vesicles are unremarkable in appearance. Other: No significant volume of ascites.  No pneumoperitoneum. Musculoskeletal: There are no aggressive appearing lytic or blastic lesions noted in the visualized portions of the skeleton. Review of the MIP images confirms the above findings. IMPRESSION: 1. Cholelithiasis and choledocholithiasis. No overt signs of biliary tract obstruction at this time, although it is unusual that the very distal common bile duct immediately before the ampulla is mildly dilated. There is some mild periportal edema in the right lobe of the liver which is nonspecific, but correlation with liver function tests is recommended. 2. Ill-defined 1.5 x 1.0 cm hypovascular lesion between segments 5 and 6 in the right lobe of the liver. This is nonspecific, and would require further evaluation with nonemergent MRI with and without IV gadolinium at some point in near future for more definitive characterization. 3. No acute findings in the thorax. 4. Atherosclerosis, including left main and 3 vessel coronary artery disease. Please note that although the presence of coronary artery calcium documents the presence of coronary artery disease, the severity of this disease and any potential stenosis cannot be assessed on this non-gated CT examination. Assessment for potential risk factor modification, dietary therapy or pharmacologic therapy may be warranted, if clinically indicated. 5. Colonic diverticulosis without evidence of acute diverticulitis at this time. 6. Additional incidental findings, as above. Electronically Signed   By: Trudie Reedaniel  Entrikin M.D.   On:  11/04/2014 08:35    CBC  Recent Labs Lab 11/04/14 0623 11/04/14 1426 11/05/14 0620  WBC 12.9* 17.5* 11.5*  HGB 15.0 13.1 12.7*  HCT 44.4 38.4* 39.1  PLT 152 140* 117*  MCV 93.4 93.7 96.1  MCH 31.6 32.0 31.2  MCHC 33.9 34.1 32.5  RDW 13.5 13.2 13.5  LYMPHSABS 0.4* 0.6* 1.0  MONOABS 0.1* 0.9 0.6  EOSABS 0.0 0.0 0.0  BASOSABS 0.0 0.0 0.0    Chemistries   Recent Labs Lab 11/04/14 0623 11/04/14 1426 11/05/14 0620  NA 139 132* 135  K 2.8* 3.3* 3.2*  CL 100* 97* 100*  CO2 23 25 27   GLUCOSE 147* 161* 113*  BUN 13 7 6   CREATININE 1.08 0.83 0.87  CALCIUM 9.0 7.6* 7.6*  MG  --  1.6*  --   AST 85* 59* 41  ALT 188* 140* 107*  ALKPHOS 180* 131* 113  BILITOT 1.3* 1.3* 1.1   ------------------------------------------------------------------------------------------------------------------ estimated creatinine clearance  is 86.7 mL/min (by C-G formula based on Cr of 0.87). ------------------------------------------------------------------------------------------------------------------ No results for input(s): HGBA1C in the last 72 hours. ------------------------------------------------------------------------------------------------------------------ No results for input(s): CHOL, HDL, LDLCALC, TRIG, CHOLHDL, LDLDIRECT in the last 72 hours. ------------------------------------------------------------------------------------------------------------------ No results for input(s): TSH, T4TOTAL, T3FREE, THYROIDAB in the last 72 hours.  Invalid input(s): FREET3 ------------------------------------------------------------------------------------------------------------------ No results for input(s): VITAMINB12, FOLATE, FERRITIN, TIBC, IRON, RETICCTPCT in the last 72 hours.  Coagulation profile  Recent Labs Lab 11/04/14 1426  INR 1.08    No results for input(s): DDIMER in the last 72 hours.  Cardiac Enzymes  Recent Labs Lab 11/04/14 0623  TROPONINI <0.03    ------------------------------------------------------------------------------------------------------------------ Invalid input(s): POCBNP  No results for input(s): GLUCAP in the last 72 hours.   RAI,RIPUDEEP M.D. [facility] 11/05/2014, 12:03 PM  Pager: [phone] Between 7am to 7pm - call Pager - [phone]  After 7pm go to [email or web address] - password [email or web address]  Call night coverage person covering after 7pm

## 2014-11-05 NOTE — Consult Note (Signed)
Reason for Consult: choledocholithiasis vs cholangitis Referring Physician: Parag Dorton is an 69 y.o. male.  HPI: 69 yo male with history of intermittent abdominal pain, came to the ER with pain, n/v, fevers, AMS and leukocytosis found to have stone in duct. He underwent ERCP with stone extraction today. His pain is much improved.   Past Medical History  Diagnosis Date  . Hypertension   . Hyperlipemia   . Anxiety   . Cholelithiasis with choledocholithiasis   . Liver lesion   . Atherosclerosis   . Diverticulosis   . Sepsis [facility])     Past Surgical History  Procedure Laterality Date  . Colonoscopy      History reviewed. No pertinent family history.  Social History:  reports that he has never smoked. He does not have any smokeless tobacco history on file. His alcohol and drug histories are not on file.  Allergies: No Known Allergies  Medications: I have reviewed the patient's current medications.  Results for orders placed or performed during the hospital encounter of 11/04/14 (from the past 48 hour(s))  CBC with Differential     Status: Abnormal   Collection Time: 11/04/14  2:26 PM  Result Value Ref Range   WBC 17.5 (H) 4.0 - 10.5 K/uL   RBC 4.10 (L) 4.22 - 5.81 MIL/uL   Hemoglobin 13.1 13.0 - 17.0 g/dL   HCT 38.4 (L) 39.0 - 52.0 %   MCV 93.7 78.0 - 100.0 fL   MCH 32.0 26.0 - 34.0 pg   MCHC 34.1 30.0 - 36.0 g/dL   RDW 13.2 11.5 - 15.5 %   Platelets 140 (L) 150 - 400 K/uL   Neutrophils Relative % 92 %   Neutro Abs 16.0 (H) 1.7 - 7.7 K/uL   Lymphocytes Relative 3 %   Lymphs Abs 0.6 (L) 0.7 - 4.0 K/uL   Monocytes Relative 5 %   Monocytes Absolute 0.9 0.1 - 1.0 K/uL   Eosinophils Relative 0 %   Eosinophils Absolute 0.0 0.0 - 0.7 K/uL   Basophils Relative 0 %   Basophils Absolute 0.0 0.0 - 0.1 K/uL  Comprehensive metabolic panel     Status: Abnormal   Collection Time: 11/04/14  2:26 PM  Result Value Ref Range   Sodium 132 (L) 135 - 145 mmol/L   Potassium  3.3 (L) 3.5 - 5.1 mmol/L   Chloride 97 (L) 101 - 111 mmol/L   CO2 25 22 - 32 mmol/L   Glucose, Bld 161 (H) 65 - 99 mg/dL   BUN 7 6 - 20 mg/dL   Creatinine, Ser 0.83 0.61 - 1.24 mg/dL   Calcium 7.6 (L) 8.9 - 10.3 mg/dL   Total Protein 5.6 (L) 6.5 - 8.1 g/dL   Albumin 2.8 (L) 3.5 - 5.0 g/dL   AST 59 (H) 15 - 41 U/L   ALT 140 (H) 17 - 63 U/L   Alkaline Phosphatase 131 (H) 38 - 126 U/L   Total Bilirubin 1.3 (H) 0.3 - 1.2 mg/dL   GFR calc non Af Amer >60 >60 mL/min   GFR calc Af Amer >60 >60 mL/min    Comment: (NOTE) The eGFR has been calculated using the CKD EPI equation. This calculation has not been validated in all clinical situations. eGFR's persistently <60 mL/min signify possible Chronic Kidney Disease.    Anion gap 10 5 - 15  Procalcitonin     Status: None   Collection Time: 11/04/14  2:26 PM  Result Value Ref Range  Procalcitonin 8.51 ng/mL    Comment:        Interpretation: PCT > 2 ng/mL: Systemic infection (sepsis) is likely, unless other causes are known. (NOTE)         ICU PCT Algorithm               Non ICU PCT Algorithm    ----------------------------     ------------------------------         PCT < 0.25 ng/mL                 PCT < 0.1 ng/mL     Stopping of antibiotics            Stopping of antibiotics       strongly encouraged.               strongly encouraged.    ----------------------------     ------------------------------       PCT level decrease by               PCT < 0.25 ng/mL       >= 80% from peak PCT       OR PCT 0.25 - 0.5 ng/mL          Stopping of antibiotics                                             encouraged.     Stopping of antibiotics           encouraged.    ----------------------------     ------------------------------       PCT level decrease by              PCT >= 0.25 ng/mL       < 80% from peak PCT        AND PCT >= 0.5 ng/mL            Continuing antibiotics                                               encouraged.        Continuing antibiotics            encouraged.    ----------------------------     ------------------------------     PCT level increase compared          PCT > 0.5 ng/mL         with peak PCT AND          PCT >= 0.5 ng/mL             Escalation of antibiotics                                          strongly encouraged.      Escalation of antibiotics        strongly encouraged.   Protime-INR     Status: None   Collection Time: 11/04/14  2:26 PM  Result Value Ref Range   Prothrombin Time 14.2 11.6 - 15.2 seconds   INR 1.08 0.00 - 1.49  APTT     Status: None   Collection Time:  11/04/14  2:26 PM  Result Value Ref Range   aPTT 29 24 - 37 seconds  Magnesium     Status: Abnormal   Collection Time: 11/04/14  2:26 PM  Result Value Ref Range   Magnesium 1.6 (L) 1.7 - 2.4 mg/dL  Lactic acid, plasma     Status: None   Collection Time: 11/04/14  2:26 PM  Result Value Ref Range   Lactic Acid, Venous 1.5 0.5 - 2.0 mmol/L  MRSA PCR Screening     Status: None   Collection Time: 11/05/14  3:30 AM  Result Value Ref Range   MRSA by PCR NEGATIVE NEGATIVE    Comment:        The GeneXpert MRSA Assay (FDA approved for NASAL specimens only), is one component of a comprehensive MRSA colonization surveillance program. It is not intended to diagnose MRSA infection nor to guide or monitor treatment for MRSA infections.   Comprehensive metabolic panel     Status: Abnormal   Collection Time: 11/05/14  6:20 AM  Result Value Ref Range   Sodium 135 135 - 145 mmol/L   Potassium 3.2 (L) 3.5 - 5.1 mmol/L   Chloride 100 (L) 101 - 111 mmol/L   CO2 27 22 - 32 mmol/L   Glucose, Bld 113 (H) 65 - 99 mg/dL   BUN 6 6 - 20 mg/dL   Creatinine, Ser 0.87 0.61 - 1.24 mg/dL   Calcium 7.6 (L) 8.9 - 10.3 mg/dL   Total Protein 5.5 (L) 6.5 - 8.1 g/dL   Albumin 2.7 (L) 3.5 - 5.0 g/dL   AST 41 15 - 41 U/L   ALT 107 (H) 17 - 63 U/L   Alkaline Phosphatase 113 38 - 126 U/L   Total Bilirubin 1.1 0.3 - 1.2 mg/dL   GFR  calc non Af Amer >60 >60 mL/min   GFR calc Af Amer >60 >60 mL/min    Comment: (NOTE) The eGFR has been calculated using the CKD EPI equation. This calculation has not been validated in all clinical situations. eGFR's persistently <60 mL/min signify possible Chronic Kidney Disease.    Anion gap 8 5 - 15  CBC     Status: Abnormal   Collection Time: 11/05/14  6:20 AM  Result Value Ref Range   WBC 11.5 (H) 4.0 - 10.5 K/uL   RBC 4.07 (L) 4.22 - 5.81 MIL/uL   Hemoglobin 12.7 (L) 13.0 - 17.0 g/dL   HCT 39.1 39.0 - 52.0 %   MCV 96.1 78.0 - 100.0 fL   MCH 31.2 26.0 - 34.0 pg   MCHC 32.5 30.0 - 36.0 g/dL   RDW 13.5 11.5 - 15.5 %   Platelets 117 (L) 150 - 400 K/uL    Comment: CONSISTENT WITH PREVIOUS RESULT  Differential     Status: Abnormal   Collection Time: 11/05/14  6:20 AM  Result Value Ref Range   Neutrophils Relative % 86 %   Neutro Abs 9.9 (H) 1.7 - 7.7 K/uL   Lymphocytes Relative 8 %   Lymphs Abs 1.0 0.7 - 4.0 K/uL   Monocytes Relative 5 %   Monocytes Absolute 0.6 0.1 - 1.0 K/uL   Eosinophils Relative 0 %   Eosinophils Absolute 0.0 0.0 - 0.7 K/uL   Basophils Relative 0 %   Basophils Absolute 0.0 0.0 - 0.1 K/uL    Ct Head Wo Contrast  11/04/2014  CLINICAL DATA:  69 year old male found at 4 a.m. to be diaphoretic and unresponsive. Previously normal mental status.  Febrile with temperature of 102.8 degrees. EXAM: CT HEAD WITHOUT CONTRAST TECHNIQUE: Contiguous axial images were obtained from the base of the skull through the vertex without intravenous contrast. COMPARISON:  No priors. FINDINGS: Patchy areas of mild decreased attenuation are noted throughout the deep and periventricular white matter of the cerebral hemispheres bilaterally, compatible with mild chronic microvascular ischemic disease. No acute intracranial abnormalities. Specifically, no evidence of acute intracranial hemorrhage, no definite findings of acute/subacute cerebral ischemia, no mass, mass effect,  hydrocephalus or abnormal intra or extra-axial fluid collections. Visualized paranasal sinuses and mastoids are well pneumatized. No acute displaced skull fractures are identified. IMPRESSION: 1. No acute intracranial abnormalities. 2. Mild chronic microvascular ischemic changes in cerebral white matter, as above. Electronically Signed   By: Vinnie Langton M.D.   On: 11/04/2014 07:58   Ct Chest Wo Contrast  11/04/2014  CLINICAL DATA:  69 year old male reportedly at baseline mental status yesterday evening found unresponsive and diaphoretic at 4 a.m. this morning. Febrile with axillary temperature of 102.8 degrees. Altered mental status. EXAM: CT CHEST WITHOUT CONTRAST, AND CTA ABDOMEN AND PELVIS WITH CONTRAST TECHNIQUE: Multidetector CT imaging of the chest was performed following the standard protocol without IV contrast. Multidetector CT imaging of the abdomen and pelvis was performed using the standard protocol during bolus administration of intravenous contrast. Multiplanar reconstructed images and MIPs were obtained and reviewed to evaluate the vascular anatomy. CONTRAST:  170mL OMNIPAQUE IOHEXOL 350 MG/ML SOLN COMPARISON:  No priors. FINDINGS: CT CHEST FINDINGS Mediastinum/Lymph Nodes: Heart size is normal. There is no significant pericardial fluid, thickening or pericardial calcification. There is atherosclerosis of the thoracic aorta, the great vessels of the mediastinum and the coronary arteries, including calcified atherosclerotic plaque in the left main, left anterior descending, left circumflex and right coronary arteries. Calcifications of the aortic valve. No pathologically enlarged mediastinal or hilar lymph nodes. Please note that accurate exclusion of hilar adenopathy is limited on noncontrast CT scans. Esophagus is unremarkable in appearance. No axillary lymphadenopathy. Lungs/Pleura: Dependent atelectasis in the lower lobes of the lungs bilaterally. No acute consolidative airspace disease.  No pleural effusions. No suspicious appearing pulmonary nodules or masses. Musculoskeletal/Soft Tissues: There are no aggressive appearing lytic or blastic lesions noted in the visualized portions of the skeleton. CTA ABDOMEN AND PELVIS FINDINGS Hepatobiliary: Mild diffuse decreased attenuation throughout the hepatic parenchyma. Multiple small calcified granulomas in the liver incidentally noted. Somewhat ill-defined ovoid shaped 1.5 x 1.0 cm hypovascular lesion between segments 5 and 6 in the liver (image 24 of series 6) is indeterminate. No other suspicious hepatic lesions are noted. Very mild periportal edema. No definite intra hepatic biliary ductal dilatation. There are some tiny high attenuation foci lying dependently in the neck of the gallbladder. In addition, in the mid common bile ducts (image 32 of series 6 and coronal image 61 of series 12) there is a 5 x 8 x 10 mm high attenuation focus, presumably a ductal stone. Proximal to this stone the common bile duct is normal in caliber measuring 7 mm in the porta hepatis. Distally, the duct dilates up to 9 mm just before the ampulla. Pancreas: No pancreatic ductal dilatation. No pancreatic mass. No pancreatic or peripancreatic fluid or inflammatory changes. Spleen: Unremarkable. Adrenals/Urinary Tract: Sub cm low-attenuation lesion in the lower pole of the left kidney is too small to characterize, but favored to represent a cyst. Right kidney is normal in appearance. Bilateral perinephric stranding (nonspecific). No hydroureteronephrosis. Urinary bladder is normal in appearance. Bilateral adrenal glands  are normal in appearance. Stomach/Bowel: Normal appearance of the stomach. No pathologic dilatation of small bowel or colon. Numerous colonic diverticulae are noted, particularly in the sigmoid colon, without surrounding inflammatory changes to suggest an acute diverticulitis at this time. Normal appendix. Vascular/Lymphatic: Atherosclerosis throughout the  abdominal and pelvic vasculature, without evidence of aneurysm or dissection. Celiac axis, superior mesenteric artery and inferior mesenteric artery and their major branches are all widely patent without definite hemodynamic significant stenosis. Single renal arteries bilaterally. No lymphadenopathy noted in the abdomen or pelvis. Reproductive: Prostate gland seminal vesicles are unremarkable in appearance. Other: No significant volume of ascites.  No pneumoperitoneum. Musculoskeletal: There are no aggressive appearing lytic or blastic lesions noted in the visualized portions of the skeleton. Review of the MIP images confirms the above findings. IMPRESSION: 1. Cholelithiasis and choledocholithiasis. No overt signs of biliary tract obstruction at this time, although it is unusual that the very distal common bile duct immediately before the ampulla is mildly dilated. There is some mild periportal edema in the right lobe of the liver which is nonspecific, but correlation with liver function tests is recommended. 2. Ill-defined 1.5 x 1.0 cm hypovascular lesion between segments 5 and 6 in the right lobe of the liver. This is nonspecific, and would require further evaluation with nonemergent MRI with and without IV gadolinium at some point in near future for more definitive characterization. 3. No acute findings in the thorax. 4. Atherosclerosis, including left main and 3 vessel coronary artery disease. Please note that although the presence of coronary artery calcium documents the presence of coronary artery disease, the severity of this disease and any potential stenosis cannot be assessed on this non-gated CT examination. Assessment for potential risk factor modification, dietary therapy or pharmacologic therapy may be warranted, if clinically indicated. 5. Colonic diverticulosis without evidence of acute diverticulitis at this time. 6. Additional incidental findings, as above. Electronically Signed   By: Vinnie Langton M.D.   On: 11/04/2014 08:35   Dg Chest Port 1 View  11/04/2014  CLINICAL DATA:  Patient was found unresponsive at 6 a.m. today. Fever. History of hypertension. EXAM: PORTABLE CHEST 1 VIEW COMPARISON:  None. FINDINGS: Shallow inspiration with atelectasis in the lung bases. Heart size and pulmonary vascularity are normal. No focal consolidation in the lungs. No blunting of costophrenic angles. No pneumothorax. Mediastinal contours appear intact. IMPRESSION: Shallow inspiration with atelectasis in the lung bases. Electronically Signed   By: Lucienne Capers M.D.   On: 11/04/2014 06:51   Dg Ercp Biliary & Pancreatic Ducts  11/05/2014  CLINICAL DATA:  Cholelithiasis and choledocholithiasis. Elevated liver function tests. Fever. EXAM: ERCP TECHNIQUE: Multiple spot images obtained with the fluoroscopic device and submitted for interpretation post-procedure. FLUOROSCOPY TIME:  Fluoroscopy Time:  1 minutes 52 seconds Number of Acquired Images:  1 COMPARISON:  None. FINDINGS: Single fluoroscopic image from an ERCP shows contrast opacification and guidewire within the common bile and common hepatic ducts. At least 2 rounded filling defects are seen within the distal common bile duct, consistent with choledocholithiasis. Mild dilatation of the distal common bile duct is demonstrated. IMPRESSION: Mild dilatation of distal common bile duct, with at least 2 distal common duct calculi. These images were submitted for radiologic interpretation only. Please see the procedural report for the amount of contrast and the fluoroscopy time utilized. Electronically Signed   By: Earle Gell M.D.   On: 11/05/2014 10:54   Ct Cta Abd/pel W/cm &/or W/o Cm  11/04/2014  CLINICAL DATA:  69 year old male reportedly at baseline mental status yesterday evening found unresponsive and diaphoretic at 4 a.m. this morning. Febrile with axillary temperature of 102.8 degrees. Altered mental status. EXAM: CT CHEST WITHOUT CONTRAST, AND  CTA ABDOMEN AND PELVIS WITH CONTRAST TECHNIQUE: Multidetector CT imaging of the chest was performed following the standard protocol without IV contrast. Multidetector CT imaging of the abdomen and pelvis was performed using the standard protocol during bolus administration of intravenous contrast. Multiplanar reconstructed images and MIPs were obtained and reviewed to evaluate the vascular anatomy. CONTRAST:  16mL OMNIPAQUE IOHEXOL 350 MG/ML SOLN COMPARISON:  No priors. FINDINGS: CT CHEST FINDINGS Mediastinum/Lymph Nodes: Heart size is normal. There is no significant pericardial fluid, thickening or pericardial calcification. There is atherosclerosis of the thoracic aorta, the great vessels of the mediastinum and the coronary arteries, including calcified atherosclerotic plaque in the left main, left anterior descending, left circumflex and right coronary arteries. Calcifications of the aortic valve. No pathologically enlarged mediastinal or hilar lymph nodes. Please note that accurate exclusion of hilar adenopathy is limited on noncontrast CT scans. Esophagus is unremarkable in appearance. No axillary lymphadenopathy. Lungs/Pleura: Dependent atelectasis in the lower lobes of the lungs bilaterally. No acute consolidative airspace disease. No pleural effusions. No suspicious appearing pulmonary nodules or masses. Musculoskeletal/Soft Tissues: There are no aggressive appearing lytic or blastic lesions noted in the visualized portions of the skeleton. CTA ABDOMEN AND PELVIS FINDINGS Hepatobiliary: Mild diffuse decreased attenuation throughout the hepatic parenchyma. Multiple small calcified granulomas in the liver incidentally noted. Somewhat ill-defined ovoid shaped 1.5 x 1.0 cm hypovascular lesion between segments 5 and 6 in the liver (image 24 of series 6) is indeterminate. No other suspicious hepatic lesions are noted. Very mild periportal edema. No definite intra hepatic biliary ductal dilatation. There are some  tiny high attenuation foci lying dependently in the neck of the gallbladder. In addition, in the mid common bile ducts (image 32 of series 6 and coronal image 61 of series 12) there is a 5 x 8 x 10 mm high attenuation focus, presumably a ductal stone. Proximal to this stone the common bile duct is normal in caliber measuring 7 mm in the porta hepatis. Distally, the duct dilates up to 9 mm just before the ampulla. Pancreas: No pancreatic ductal dilatation. No pancreatic mass. No pancreatic or peripancreatic fluid or inflammatory changes. Spleen: Unremarkable. Adrenals/Urinary Tract: Sub cm low-attenuation lesion in the lower pole of the left kidney is too small to characterize, but favored to represent a cyst. Right kidney is normal in appearance. Bilateral perinephric stranding (nonspecific). No hydroureteronephrosis. Urinary bladder is normal in appearance. Bilateral adrenal glands are normal in appearance. Stomach/Bowel: Normal appearance of the stomach. No pathologic dilatation of small bowel or colon. Numerous colonic diverticulae are noted, particularly in the sigmoid colon, without surrounding inflammatory changes to suggest an acute diverticulitis at this time. Normal appendix. Vascular/Lymphatic: Atherosclerosis throughout the abdominal and pelvic vasculature, without evidence of aneurysm or dissection. Celiac axis, superior mesenteric artery and inferior mesenteric artery and their major branches are all widely patent without definite hemodynamic significant stenosis. Single renal arteries bilaterally. No lymphadenopathy noted in the abdomen or pelvis. Reproductive: Prostate gland seminal vesicles are unremarkable in appearance. Other: No significant volume of ascites.  No pneumoperitoneum. Musculoskeletal: There are no aggressive appearing lytic or blastic lesions noted in the visualized portions of the skeleton. Review of the MIP images confirms the above findings. IMPRESSION: 1. Cholelithiasis and  choledocholithiasis. No overt signs of biliary tract obstruction at this  time, although it is unusual that the very distal common bile duct immediately before the ampulla is mildly dilated. There is some mild periportal edema in the right lobe of the liver which is nonspecific, but correlation with liver function tests is recommended. 2. Ill-defined 1.5 x 1.0 cm hypovascular lesion between segments 5 and 6 in the right lobe of the liver. This is nonspecific, and would require further evaluation with nonemergent MRI with and without IV gadolinium at some point in near future for more definitive characterization. 3. No acute findings in the thorax. 4. Atherosclerosis, including left main and 3 vessel coronary artery disease. Please note that although the presence of coronary artery calcium documents the presence of coronary artery disease, the severity of this disease and any potential stenosis cannot be assessed on this non-gated CT examination. Assessment for potential risk factor modification, dietary therapy or pharmacologic therapy may be warranted, if clinically indicated. 5. Colonic diverticulosis without evidence of acute diverticulitis at this time. 6. Additional incidental findings, as above. Electronically Signed   By: Vinnie Langton M.D.   On: 11/04/2014 08:35    Review of Systems  Constitutional: Negative for fever and chills.  HENT: Negative for hearing loss.   Eyes: Negative for pain and discharge.  Respiratory: Negative for cough and hemoptysis.   Cardiovascular: Negative for chest pain and palpitations.  Gastrointestinal: Positive for abdominal pain. Negative for heartburn and nausea.  Genitourinary: Negative for dysuria and urgency.  Musculoskeletal: Negative for myalgias and neck pain.  Skin: Negative for itching and rash.  Neurological: Negative for dizziness, tingling and headaches.  Endo/Heme/Allergies: Negative for environmental allergies. Does not bruise/bleed easily.   Psychiatric/Behavioral: Negative for depression and suicidal ideas.   Blood pressure 115/65, pulse 91, temperature 98.9 F (37.2 C), temperature source Oral, resp. rate 20, height 6' (1.829 m), weight 87 kg (191 lb 12.8 oz), SpO2 96 %. Physical Exam  Constitutional: He is oriented to person, place, and time. He appears well-developed and well-nourished.  HENT:  Head: Normocephalic and atraumatic.  Eyes: Conjunctivae are normal. Pupils are equal, round, and reactive to light.  Neck: Normal range of motion. Neck supple.  Cardiovascular: Normal rate and regular rhythm.   Respiratory: Effort normal and breath sounds normal.  GI: Soft. Bowel sounds are normal. There is negative Murphy's sign.  Musculoskeletal: Normal range of motion.  Neurological: He is alert and oriented to person, place, and time. No cranial nerve deficit.  Skin: Skin is warm and dry.  Psychiatric: He has a normal mood and affect. His behavior is normal.    Assessment/Plan: 69 yo male with at least choledocholithiasis s/p ERCP with stone extraction. No previous abdominal surgeries -I spoke at length that the best course of action was laparoscopic vs open cholecystectomy prior to discharge -continue IV abx -clears after midnight   Arta Bruce Yusif Gnau 11/05/2014, 2:11 PM

## 2014-11-05 NOTE — Transfer of Care (Signed)
Immediate Anesthesia Transfer of Care Note  Patient: Jesus White  Procedure(s) Performed: Procedure(s): ENDOSCOPIC RETROGRADE CHOLANGIOPANCREATOGRAPHY (ERCP) (N/A)  Patient Location: PACU  Anesthesia Type:General  Level of Consciousness: sedated  Airway & Oxygen Therapy: Patient Spontanous Breathing and Patient connected to nasal cannula oxygen  Post-op Assessment: Report given to RN and Post -op Vital signs reviewed and stable  Post vital signs: Reviewed and stable  Last Vitals:  Filed Vitals:   11/05/14 1039  BP: 122/66  Pulse: 97  Temp: 37.6 C  Resp: 24    Complications: No apparent anesthesia complications

## 2014-11-05 NOTE — Progress Notes (Signed)
GI addendum: ERCP done, single large stone extracted as seen on CT scan. Excellent drainage of clear Amber bile at the beginning of the procedure. Note the patient did have a temperature of 102 prior to the procedure. We'll continue Zosyn and vancomycin for now. Recommend blood urine or any other culture to rule out alternate source of infection as indicated.

## 2014-11-06 ENCOUNTER — Encounter (HOSPITAL_COMMUNITY): Payer: Self-pay | Admitting: Gastroenterology

## 2014-11-06 DIAGNOSIS — K7689 Other specified diseases of liver: Secondary | ICD-10-CM

## 2014-11-06 DIAGNOSIS — E872 Acidosis: Secondary | ICD-10-CM

## 2014-11-06 DIAGNOSIS — E876 Hypokalemia: Secondary | ICD-10-CM

## 2014-11-06 LAB — COMPREHENSIVE METABOLIC PANEL
ALK PHOS: 107 U/L (ref 38–126)
ALT: 90 U/L — ABNORMAL HIGH (ref 17–63)
ANION GAP: 11 (ref 5–15)
AST: 40 U/L (ref 15–41)
Albumin: 3.2 g/dL — ABNORMAL LOW (ref 3.5–5.0)
BILIRUBIN TOTAL: 1.3 mg/dL — AB (ref 0.3–1.2)
BUN: 6 mg/dL (ref 6–20)
CALCIUM: 8.4 mg/dL — AB (ref 8.9–10.3)
CO2: 22 mmol/L (ref 22–32)
Chloride: 105 mmol/L (ref 101–111)
Creatinine, Ser: 0.82 mg/dL (ref 0.61–1.24)
Glucose, Bld: 96 mg/dL (ref 65–99)
POTASSIUM: 4.2 mmol/L (ref 3.5–5.1)
Sodium: 138 mmol/L (ref 135–145)
TOTAL PROTEIN: 6.1 g/dL — AB (ref 6.5–8.1)

## 2014-11-06 LAB — CBC WITH DIFFERENTIAL/PLATELET
Basophils Absolute: 0 10*3/uL (ref 0.0–0.1)
Basophils Relative: 0 %
Eosinophils Absolute: 0.1 10*3/uL (ref 0.0–0.7)
Eosinophils Relative: 1 %
HEMATOCRIT: 40.9 % (ref 39.0–52.0)
HEMOGLOBIN: 13.9 g/dL (ref 13.0–17.0)
LYMPHS ABS: 2 10*3/uL (ref 0.7–4.0)
Lymphocytes Relative: 17 %
MCH: 32.5 pg (ref 26.0–34.0)
MCHC: 34 g/dL (ref 30.0–36.0)
MCV: 95.6 fL (ref 78.0–100.0)
MONO ABS: 1 10*3/uL (ref 0.1–1.0)
Monocytes Relative: 8 %
NEUTROS ABS: 9 10*3/uL — AB (ref 1.7–7.7)
NEUTROS PCT: 75 %
Platelets: 115 10*3/uL — ABNORMAL LOW (ref 150–400)
RBC: 4.28 MIL/uL (ref 4.22–5.81)
RDW: 13.4 % (ref 11.5–15.5)
WBC: 12 10*3/uL — ABNORMAL HIGH (ref 4.0–10.5)

## 2014-11-06 LAB — URINE CULTURE: CULTURE: NO GROWTH

## 2014-11-06 LAB — SURGICAL PCR SCREEN
MRSA, PCR: NEGATIVE
Staphylococcus aureus: POSITIVE — AB

## 2014-11-06 NOTE — Progress Notes (Signed)
[facility]                                                                              Patient Demographics  Jesus White, is a 69 y.o. male, DOB - 07/23/1945, ZOX:[id number]  Admit date - 11/04/2014   Admitting Physician Clydie Braun, MD  Outpatient Primary MD for the patient is Marisue Ivan, MD  LOS - 2   No chief complaint on file.      Brief HPI   Per Dr. Katrinka Blazing on 11/04/14 Jesus White is a 69 y.o. male with a past medical history that includes hypertension, hyperlipidemia, anxiety presents to room 34 on 5 W. at [facility] hospital from the emergency department at [facility] with fever, tachycardia and acute encephalopathy. Initial workup at [place] includes CT of the abdomen pelvis concerning for choledocholithiasis with cholelithiasis. He was transferred to [place] is GI services were not immediately available. History was obtained from the patient who reported that he was having chills, feeling "feverish" yesterday worsening in the evening to the point he could not "stop shaking" he reports his next memory is waking up in the [facility] emergency department. Chart indicates patient was brought to the hospital as his wife found him unresponsive early this morning. EMS reports when they arrived he had an axillary temp of 102.8. It is noted that he was given 500 mils of normal saline in and route he became more alert but remained confused. He was provided with cooling packs. Wife reported patient did complain of some abdominal pain the prior day. Wife indicated at 4am patient restless in bed and she believes he had emesis. No report of diarrhea. She did indicate he was not eating or drinking as he normally does. Upon arrival to [facility] ED, patient had fever, tachycardia and was confused. code sepsis was called patient was given 3 L of normal saline he was also provided with vancomycin and Zosyn for an unknown infection. Additionally he  received Tylenol rectally. Chest x-ray was unremarkable, urinalysis unremarkable, CT of the abdomen concerning for choledoholithiasis with cholelithiasis. Lactic acid 6.2 and within normal limits upon arrival to [facility]. Chart indicates gastroenterology not available so patient was transferred and Dr. Madilyn Fireman with [facility] gastroenterology will consult.    Assessment & Plan   Principal Problem:  SIRS (systemic inflammatory response syndrome) (HCC), cholangitis, transaminitis:  - CT abdomen showed cholelithiasis and choledocholithiasis, ill-defined 1.5 x 1 cm hypovascular lesion in the right lobe of the liver, recommended nonemergent MRI  - Gastroenterology consulted, ERCP showed single ovoid common bile duct stones removed after sphincterotomy, surgery consulted -  continue IV fluid hydration - Gen. surgery planning cholecystectomy 10/18 - Continue IV broad-spectrum antibiotics, blood cultures and urine culture negative so far   Active Problems: Acute encephalopathy: Related to above, currently at baseline. Much improved after fluid resuscitation. CT of the head with no acute abnormalities.   Lactic acidosis: Secondary to #1 - Lactic acid level within the limits of normal 3 hours after fluid resuscitation  Hypertension: - Currently stable monitor closely   Hyperlipidemia: No statins can do to transaminitis   Liver lesion:  Incidental finding per CT. Outpatient follow-up with MRI   Coronary atherosclerosis: Per CT. Patient denies history of same. No chest pain. Outpatient follow-up   Code Status: Full code  Family Communication: Discussed in detail with the patient, all imaging results, lab results explained to the patient    Disposition Plan:  Time Spent in minutes   25 minutes  Procedures  ERCP  Consults   Gastroenterology  DVT Prophylaxis Lovenox  Medications  Scheduled Meds: . antiseptic oral rinse  7 mL Mouth Rinse BID  . piperacillin-tazobactam (ZOSYN)  IV   3.375 g Intravenous 3 times per day  . sodium chloride  3 mL Intravenous Q12H  . vancomycin  1,000 mg Intravenous Q12H   Continuous Infusions: . sodium chloride 125 mL/hr at 11/05/14 2121   PRN Meds:.acetaminophen **OR** acetaminophen, HYDROmorphone (DILAUDID) injection, ondansetron **OR** ondansetron (ZOFRAN) IV   Antibiotics   Anti-infectives    Start     Dose/Rate Route Frequency Ordered Stop   11/05/14 0300  vancomycin (VANCOCIN) IVPB 1000 mg/200 mL premix     1,000 mg 200 mL/hr over 60 Minutes Intravenous Every 12 hours 11/04/14 2209     11/04/14 1400  piperacillin-tazobactam (ZOSYN) IVPB 3.375 g     3.375 g 12.5 mL/hr over 240 Minutes Intravenous 3 times per day 11/04/14 1323     11/04/14 1330  piperacillin-tazobactam (ZOSYN) IVPB 3.375 g  Status:  Discontinued     3.375 g 100 mL/hr over 30 Minutes Intravenous  Once 11/04/14 1304 11/04/14 1323   11/04/14 1330  vancomycin (VANCOCIN) IVPB 1000 mg/200 mL premix     1,000 mg 200 mL/hr over 60 Minutes Intravenous  Once 11/04/14 1304 11/04/14 1459        Subjective:   Jesus White was seen and examined today.  Patient denies dizziness, chest pain, shortness of breath,  abdominal pain, nausea or vomiting, new weakness, numbess, tingling. No acute events overnight.    Objective:   Blood pressure 141/72, pulse 75, temperature 97.8 F (36.6 C), temperature source Oral, resp. rate 18, height 6' (1.829 m), weight 87 kg (191 lb 12.8 oz), SpO2 97 %.  Wt Readings from Last 3 Encounters:  11/04/14 87 kg (191 lb 12.8 oz)  11/04/14 85.14 kg (187 lb 11.2 oz)     Intake/Output Summary (Last 24 hours) at 11/06/14 1522 Last data filed at 11/06/14 1428  Gross per 24 hour  Intake 2033.75 ml  Output      0 ml  Net 2033.75 ml    Exam  General: Alert and oriented x 3, NAD  HEENT:  PERRLA, EOMI, Anicteric Sclera, mucous membranes moist.   Neck: Supple, no JVD, no masses  CVS: S1 S2 clear, RRR  Respiratory: CTAB  Abdomen:  Soft, nontender, nondistended, + bowel sounds  Ext: no cyanosis clubbing or edema  Neuro: no new deficits  Skin: No rashes  Psych: Normal affect and demeanor, alert and oriented x3    Data Review   Micro Results Recent Results (from the past 240 hour(s))  Blood Culture (routine x 2)     Status: None (Preliminary result)   Collection Time: 11/04/14  6:23 AM  Result Value Ref Range Status   Specimen Description BLOOD LEFT HAND  Final   Special Requests BOTTLES DRAWN AEROBIC AND ANAEROBIC 4CC  Final   Culture NO GROWTH 2 DAYS  Final   Report Status PENDING  Incomplete  Blood Culture (routine x 2)     Status: None (Preliminary result)  Collection Time: 11/04/14  6:35 AM  Result Value Ref Range Status   Specimen Description BLOOD LEFT ARM  Final   Special Requests BOTTLES DRAWN AEROBIC AND ANAEROBIC 4CC  Final   Culture NO GROWTH 2 DAYS  Final   Report Status PENDING  Incomplete  Urine culture     Status: None   Collection Time: 11/04/14  6:50 AM  Result Value Ref Range Status   Specimen Description URINE, RANDOM  Final   Special Requests NONE  Final   Culture NO GROWTH 2 DAYS  Final   Report Status 11/06/2014 FINAL  Final  Culture, blood (x 2)     Status: None (Preliminary result)   Collection Time: 11/04/14  2:26 PM  Result Value Ref Range Status   Specimen Description BLOOD RIGHT HAND  Final   Special Requests BOTTLES DRAWN AEROBIC AND ANAEROBIC 10CC  Final   Culture NO GROWTH 2 DAYS  Final   Report Status PENDING  Incomplete  Culture, blood (x 2)     Status: None (Preliminary result)   Collection Time: 11/04/14  2:34 PM  Result Value Ref Range Status   Specimen Description BLOOD LEFT HAND  Final   Special Requests BOTTLES DRAWN AEROBIC AND ANAEROBIC 10CC  Final   Culture NO GROWTH 2 DAYS  Final   Report Status PENDING  Incomplete  MRSA PCR Screening     Status: None   Collection Time: 11/05/14  3:30 AM  Result Value Ref Range Status   MRSA by PCR NEGATIVE NEGATIVE  Final    Comment:        The GeneXpert MRSA Assay (FDA approved for NASAL specimens only), is one component of a comprehensive MRSA colonization surveillance program. It is not intended to diagnose MRSA infection nor to guide or monitor treatment for MRSA infections.     Radiology Reports Ct Head Wo Contrast  11/04/2014  CLINICAL DATA:  69 year old male found at 4 a.m. to be diaphoretic and unresponsive. Previously normal mental status. Febrile with temperature of 102.8 degrees. EXAM: CT HEAD WITHOUT CONTRAST TECHNIQUE: Contiguous axial images were obtained from the base of the skull through the vertex without intravenous contrast. COMPARISON:  No priors. FINDINGS: Patchy areas of mild decreased attenuation are noted throughout the deep and periventricular white matter of the cerebral hemispheres bilaterally, compatible with mild chronic microvascular ischemic disease. No acute intracranial abnormalities. Specifically, no evidence of acute intracranial hemorrhage, no definite findings of acute/subacute cerebral ischemia, no mass, mass effect, hydrocephalus or abnormal intra or extra-axial fluid collections. Visualized paranasal sinuses and mastoids are well pneumatized. No acute displaced skull fractures are identified. IMPRESSION: 1. No acute intracranial abnormalities. 2. Mild chronic microvascular ischemic changes in cerebral white matter, as above. Electronically Signed   By: Trudie Reedaniel  Entrikin M.D.   On: 11/04/2014 07:58   Ct Chest Wo Contrast  11/04/2014  CLINICAL DATA:  69 year old male reportedly at baseline mental status yesterday evening found unresponsive and diaphoretic at 4 a.m. this morning. Febrile with axillary temperature of 102.8 degrees. Altered mental status. EXAM: CT CHEST WITHOUT CONTRAST, AND CTA ABDOMEN AND PELVIS WITH CONTRAST TECHNIQUE: Multidetector CT imaging of the chest was performed following the standard protocol without IV contrast. Multidetector CT imaging of the  abdomen and pelvis was performed using the standard protocol during bolus administration of intravenous contrast. Multiplanar reconstructed images and MIPs were obtained and reviewed to evaluate the vascular anatomy. CONTRAST:  100mL OMNIPAQUE IOHEXOL 350 MG/ML SOLN COMPARISON:  No priors. FINDINGS: CT CHEST  FINDINGS Mediastinum/Lymph Nodes: Heart size is normal. There is no significant pericardial fluid, thickening or pericardial calcification. There is atherosclerosis of the thoracic aorta, the great vessels of the mediastinum and the coronary arteries, including calcified atherosclerotic plaque in the left main, left anterior descending, left circumflex and right coronary arteries. Calcifications of the aortic valve. No pathologically enlarged mediastinal or hilar lymph nodes. Please note that accurate exclusion of hilar adenopathy is limited on noncontrast CT scans. Esophagus is unremarkable in appearance. No axillary lymphadenopathy. Lungs/Pleura: Dependent atelectasis in the lower lobes of the lungs bilaterally. No acute consolidative airspace disease. No pleural effusions. No suspicious appearing pulmonary nodules or masses. Musculoskeletal/Soft Tissues: There are no aggressive appearing lytic or blastic lesions noted in the visualized portions of the skeleton. CTA ABDOMEN AND PELVIS FINDINGS Hepatobiliary: Mild diffuse decreased attenuation throughout the hepatic parenchyma. Multiple small calcified granulomas in the liver incidentally noted. Somewhat ill-defined ovoid shaped 1.5 x 1.0 cm hypovascular lesion between segments 5 and 6 in the liver (image 24 of series 6) is indeterminate. No other suspicious hepatic lesions are noted. Very mild periportal edema. No definite intra hepatic biliary ductal dilatation. There are some tiny high attenuation foci lying dependently in the neck of the gallbladder. In addition, in the mid common bile ducts (image 32 of series 6 and coronal image 61 of series 12) there is  a 5 x 8 x 10 mm high attenuation focus, presumably a ductal stone. Proximal to this stone the common bile duct is normal in caliber measuring 7 mm in the porta hepatis. Distally, the duct dilates up to 9 mm just before the ampulla. Pancreas: No pancreatic ductal dilatation. No pancreatic mass. No pancreatic or peripancreatic fluid or inflammatory changes. Spleen: Unremarkable. Adrenals/Urinary Tract: Sub cm low-attenuation lesion in the lower pole of the left kidney is too small to characterize, but favored to represent a cyst. Right kidney is normal in appearance. Bilateral perinephric stranding (nonspecific). No hydroureteronephrosis. Urinary bladder is normal in appearance. Bilateral adrenal glands are normal in appearance. Stomach/Bowel: Normal appearance of the stomach. No pathologic dilatation of small bowel or colon. Numerous colonic diverticulae are noted, particularly in the sigmoid colon, without surrounding inflammatory changes to suggest an acute diverticulitis at this time. Normal appendix. Vascular/Lymphatic: Atherosclerosis throughout the abdominal and pelvic vasculature, without evidence of aneurysm or dissection. Celiac axis, superior mesenteric artery and inferior mesenteric artery and their major branches are all widely patent without definite hemodynamic significant stenosis. Single renal arteries bilaterally. No lymphadenopathy noted in the abdomen or pelvis. Reproductive: Prostate gland seminal vesicles are unremarkable in appearance. Other: No significant volume of ascites.  No pneumoperitoneum. Musculoskeletal: There are no aggressive appearing lytic or blastic lesions noted in the visualized portions of the skeleton. Review of the MIP images confirms the above findings. IMPRESSION: 1. Cholelithiasis and choledocholithiasis. No overt signs of biliary tract obstruction at this time, although it is unusual that the very distal common bile duct immediately before the ampulla is mildly dilated.  There is some mild periportal edema in the right lobe of the liver which is nonspecific, but correlation with liver function tests is recommended. 2. Ill-defined 1.5 x 1.0 cm hypovascular lesion between segments 5 and 6 in the right lobe of the liver. This is nonspecific, and would require further evaluation with nonemergent MRI with and without IV gadolinium at some point in near future for more definitive characterization. 3. No acute findings in the thorax. 4. Atherosclerosis, including left main and 3 vessel coronary artery  disease. Please note that although the presence of coronary artery calcium documents the presence of coronary artery disease, the severity of this disease and any potential stenosis cannot be assessed on this non-gated CT examination. Assessment for potential risk factor modification, dietary therapy or pharmacologic therapy may be warranted, if clinically indicated. 5. Colonic diverticulosis without evidence of acute diverticulitis at this time. 6. Additional incidental findings, as above. Electronically Signed   By: Trudie Reed M.D.   On: 11/04/2014 08:35   Dg Chest Port 1 View  11/04/2014  CLINICAL DATA:  Patient was found unresponsive at 6 a.m. today. Fever. History of hypertension. EXAM: PORTABLE CHEST 1 VIEW COMPARISON:  None. FINDINGS: Shallow inspiration with atelectasis in the lung bases. Heart size and pulmonary vascularity are normal. No focal consolidation in the lungs. No blunting of costophrenic angles. No pneumothorax. Mediastinal contours appear intact. IMPRESSION: Shallow inspiration with atelectasis in the lung bases. Electronically Signed   By: Burman Nieves M.D.   On: 11/04/2014 06:51   Dg Ercp Biliary & Pancreatic Ducts  11/05/2014  CLINICAL DATA:  Cholelithiasis and choledocholithiasis. Elevated liver function tests. Fever. EXAM: ERCP TECHNIQUE: Multiple spot images obtained with the fluoroscopic device and submitted for interpretation post-procedure.  FLUOROSCOPY TIME:  Fluoroscopy Time:  1 minutes 52 seconds Number of Acquired Images:  1 COMPARISON:  None. FINDINGS: Single fluoroscopic image from an ERCP shows contrast opacification and guidewire within the common bile and common hepatic ducts. At least 2 rounded filling defects are seen within the distal common bile duct, consistent with choledocholithiasis. Mild dilatation of the distal common bile duct is demonstrated. IMPRESSION: Mild dilatation of distal common bile duct, with at least 2 distal common duct calculi. These images were submitted for radiologic interpretation only. Please see the procedural report for the amount of contrast and the fluoroscopy time utilized. Electronically Signed   By: Myles Rosenthal M.D.   On: 11/05/2014 10:54   Ct Cta Abd/pel W/cm &/or W/o Cm  11/04/2014  CLINICAL DATA:  69 year old male reportedly at baseline mental status yesterday evening found unresponsive and diaphoretic at 4 a.m. this morning. Febrile with axillary temperature of 102.8 degrees. Altered mental status. EXAM: CT CHEST WITHOUT CONTRAST, AND CTA ABDOMEN AND PELVIS WITH CONTRAST TECHNIQUE: Multidetector CT imaging of the chest was performed following the standard protocol without IV contrast. Multidetector CT imaging of the abdomen and pelvis was performed using the standard protocol during bolus administration of intravenous contrast. Multiplanar reconstructed images and MIPs were obtained and reviewed to evaluate the vascular anatomy. CONTRAST:  OMNIPAQUE IOHEXOL 350 MG/ML SOLN COMPARISON:  No priors. FINDINGS: CT CHEST FINDINGS Mediastinum/Lymph Nodes: Heart size is normal. There is no significant pericardial fluid, thickening or pericardial calcification. There is atherosclerosis of the thoracic aorta, the great vessels of the mediastinum and the coronary arteries, including calcified atherosclerotic plaque in the left main, left anterior descending, left circumflex and right coronary arteries.  Calcifications of the aortic valve. No pathologically enlarged mediastinal or hilar lymph nodes. Please note that accurate exclusion of hilar adenopathy is limited on noncontrast CT scans. Esophagus is unremarkable in appearance. No axillary lymphadenopathy. Lungs/Pleura: Dependent atelectasis in the lower lobes of the lungs bilaterally. No acute consolidative airspace disease. No pleural effusions. No suspicious appearing pulmonary nodules or masses. Musculoskeletal/Soft Tissues: There are no aggressive appearing lytic or blastic lesions noted in the visualized portions of the skeleton. CTA ABDOMEN AND PELVIS FINDINGS Hepatobiliary: Mild diffuse decreased attenuation throughout the hepatic parenchyma. Multiple small calcified granulomas  in the liver incidentally noted. Somewhat ill-defined ovoid shaped 1.5 x 1.0 cm hypovascular lesion between segments 5 and 6 in the liver (image 24 of series 6) is indeterminate. No other suspicious hepatic lesions are noted. Very mild periportal edema. No definite intra hepatic biliary ductal dilatation. There are some tiny high attenuation foci lying dependently in the neck of the gallbladder. In addition, in the mid common bile ducts (image 32 of series 6 and coronal image 61 of series 12) there is a 5 x 8 x 10 mm high attenuation focus, presumably a ductal stone. Proximal to this stone the common bile duct is normal in caliber measuring 7 mm in the porta hepatis. Distally, the duct dilates up to 9 mm just before the ampulla. Pancreas: No pancreatic ductal dilatation. No pancreatic mass. No pancreatic or peripancreatic fluid or inflammatory changes. Spleen: Unremarkable. Adrenals/Urinary Tract: Sub cm low-attenuation lesion in the lower pole of the left kidney is too small to characterize, but favored to represent a cyst. Right kidney is normal in appearance. Bilateral perinephric stranding (nonspecific). No hydroureteronephrosis. Urinary bladder is normal in appearance.  Bilateral adrenal glands are normal in appearance. Stomach/Bowel: Normal appearance of the stomach. No pathologic dilatation of small bowel or colon. Numerous colonic diverticulae are noted, particularly in the sigmoid colon, without surrounding inflammatory changes to suggest an acute diverticulitis at this time. Normal appendix. Vascular/Lymphatic: Atherosclerosis throughout the abdominal and pelvic vasculature, without evidence of aneurysm or dissection. Celiac axis, superior mesenteric artery and inferior mesenteric artery and their major branches are all widely patent without definite hemodynamic significant stenosis. Single renal arteries bilaterally. No lymphadenopathy noted in the abdomen or pelvis. Reproductive: Prostate gland seminal vesicles are unremarkable in appearance. Other: No significant volume of ascites.  No pneumoperitoneum. Musculoskeletal: There are no aggressive appearing lytic or blastic lesions noted in the visualized portions of the skeleton. Review of the MIP images confirms the above findings. IMPRESSION: 1. Cholelithiasis and choledocholithiasis. No overt signs of biliary tract obstruction at this time, although it is unusual that the very distal common bile duct immediately before the ampulla is mildly dilated. There is some mild periportal edema in the right lobe of the liver which is nonspecific, but correlation with liver function tests is recommended. 2. Ill-defined 1.5 x 1.0 cm hypovascular lesion between segments 5 and 6 in the right lobe of the liver. This is nonspecific, and would require further evaluation with nonemergent MRI with and without IV gadolinium at some point in near future for more definitive characterization. 3. No acute findings in the thorax. 4. Atherosclerosis, including left main and 3 vessel coronary artery disease. Please note that although the presence of coronary artery calcium documents the presence of coronary artery disease, the severity of this  disease and any potential stenosis cannot be assessed on this non-gated CT examination. Assessment for potential risk factor modification, dietary therapy or pharmacologic therapy may be warranted, if clinically indicated. 5. Colonic diverticulosis without evidence of acute diverticulitis at this time. 6. Additional incidental findings, as above. Electronically Signed   By: Trudie Reed M.D.   On: 11/04/2014 08:35    CBC  Recent Labs Lab 11/04/14 0623 11/04/14 1426 11/05/14 0620 11/06/14 0016  WBC 12.9* 17.5* 11.5* 12.0*  HGB 15.0 13.1 12.7* 13.9  HCT 44.4 38.4* 39.1 40.9  PLT 152 140* 117* 115*  MCV 93.4 93.7 96.1 95.6  MCH 31.6 32.0 31.2 32.5  MCHC 33.9 34.1 32.5 34.0  RDW 13.5 13.2 13.5 13.4  LYMPHSABS  0.4* 0.6* 1.0 2.0  MONOABS 0.1* 0.9 0.6 1.0  EOSABS 0.0 0.0 0.0 0.1  BASOSABS 0.0 0.0 0.0 0.0    Chemistries   Recent Labs Lab 11/04/14 0623 11/04/14 1426 11/05/14 0620 11/06/14 0016  NA 139 132* 135 138  K 2.8* 3.3* 3.2* 4.2  CL 100* 97* 100* 105  CO2 23 25 27 22   GLUCOSE 147* 161* 113* 96  BUN 13 7 6 6   CREATININE 1.08 0.83 0.87 0.82  CALCIUM 9.0 7.6* 7.6* 8.4*  MG  --  1.6*  --   --   AST 85* 59* 41 40  ALT 188* 140* 107* 90*  ALKPHOS 180* 131* 113 107  BILITOT 1.3* 1.3* 1.1 1.3*   ------------------------------------------------------------------------------------------------------------------ estimated creatinine clearance is 92 mL/min (by C-G formula based on Cr of 0.82). ------------------------------------------------------------------------------------------------------------------ No results for input(s): HGBA1C in the last 72 hours. ------------------------------------------------------------------------------------------------------------------ No results for input(s): CHOL, HDL, LDLCALC, TRIG, CHOLHDL, LDLDIRECT in the last 72  hours. ------------------------------------------------------------------------------------------------------------------ No results for input(s): TSH, T4TOTAL, T3FREE, THYROIDAB in the last 72 hours.  Invalid input(s): FREET3 ------------------------------------------------------------------------------------------------------------------ No results for input(s): VITAMINB12, FOLATE, FERRITIN, TIBC, IRON, RETICCTPCT in the last 72 hours.  Coagulation profile  Recent Labs Lab 11/04/14 1426  INR 1.08    No results for input(s): DDIMER in the last 72 hours.  Cardiac Enzymes  Recent Labs Lab 11/04/14 0623  TROPONINI <0.03   ------------------------------------------------------------------------------------------------------------------ Invalid input(s): POCBNP  No results for input(s): GLUCAP in the last 72 hours.   RAI,RIPUDEEP M.D. [facility] 11/06/2014, 3:22 PM  Pager: [phone] Between 7am to 7pm - call Pager - [phone]  After 7pm go to [email or web address] - password [email or web address]  Call night coverage person covering after 7pm

## 2014-11-06 NOTE — Progress Notes (Signed)
Report given to Cassandra, RN.

## 2014-11-06 NOTE — Progress Notes (Signed)
Patient ID: Jesus White, male   DOB: 06-07-1945, 69 y.o.   MRN: [id number] [facility] Progress Note  Jesus LisDarryl H White 69 y.o. 06-07-1945   Subjective: Doing well. Denies abdominal pain. Sitting up in chair.  Objective: Vital signs: Filed Vitals:   11/06/14 0517  BP: 123/62  Pulse: 73  Temp: 98.5 F (36.9 C)  Resp: 18    Physical Exam: Gen: alert, no acute distress  HEENT: anicteric CV: RRR Chest: CTA B Abd: soft, nontender, nondistended, +BS  Lab Results:  Recent Labs  11/04/14 1426 11/05/14 0620 11/06/14 0016  NA 132* 135 138  K 3.3* 3.2* 4.2  CL 97* 100* 105  CO2 25 27 22   GLUCOSE 161* 113* 96  BUN 7 6 6   CREATININE 0.83 0.87 0.82  CALCIUM 7.6* 7.6* 8.4*  MG 1.6*  --   --     Recent Labs  11/05/14 0620 11/06/14 0016  AST 41 40  ALT 107* 90*  ALKPHOS 113 107  BILITOT 1.1 1.3*  PROT 5.5* 6.1*  ALBUMIN 2.7* 3.2*    Recent Labs  11/05/14 0620 11/06/14 0016  WBC 11.5* 12.0*  NEUTROABS 9.9* 9.0*  HGB 12.7* 13.9  HCT 39.1 40.9  MCV 96.1 95.6  PLT 117* 115*      Assessment/Plan: CBD stone s/p removal with ERCP yesterday doing well. Awaiting gallbladder surgery prior to discharge. Will sign off. Call if questions.   Kae Lauman C. 11/06/2014, 8:42 AM  Pager [phone]  If no answer or after 5 PM call [phone]

## 2014-11-06 NOTE — Care Management Note (Signed)
Case Management Note  Patient Details  Name: Jesus White MRN: [id number] Date of Birth: 09-24-1945  Subjective/Objective:     Patient lives with spouse, conts on ivf's and iv abx, s/p ERCP yesterday, plan for lap chole tomorrow.  NCM will cont to follow for dc needs.                Action/Plan:   Expected Discharge Date:                  Expected Discharge Plan:  Home w Home Health Services  In-House Referral:     Discharge planning Services  CM Consult  Post Acute Care Choice:    Choice offered to:     DME Arranged:    DME Agency:     HH Arranged:    HH Agency:     Status of Service:  In process, will continue to follow  Medicare Important Message Given:    Date Medicare IM Given:    Medicare IM give by:    Date Additional Medicare IM Given:    discussed:   Additional Medicare Important Message give by:    If discussed at Long Length of Stay Meetings, dates   Additional Comments:  Leone Havenaylor, Jeany Seville Clinton, RN 11/06/2014, 6:32 PM

## 2014-11-06 NOTE — Progress Notes (Signed)
Patient ID: Jesus White, male   DOB: 03/24/1945, 69 y.o.   MRN: [id number] 1 Day Post-Op  Subjective: Feels well.  No complaints  Objective: Vital signs in last 24 hours: Temp:  [97.4 F (36.3 C)-99.7 F (37.6 C)] 98.5 F (36.9 C) (10/17 0517) Pulse Rate:  [71-97] 73 (10/17 0517) Resp:  [18-24] 18 (10/17 0517) BP: (109-125)/(57-66) 123/62 mmHg (10/17 0517) SpO2:  [92 %-99 %] 92 % (10/17 0517) Last BM Date: 11/03/14  Intake/Output from previous day: 10/16 0701 - 10/17 0700 In: 2902.5 [P.O.:240; I.V.:2412.5; IV Piggyback:250] Out: 600 [Urine:600] Intake/Output this shift:    PE: Abd: soft, NT, nD, +BS  Lab Results:   Recent Labs  11/05/14 0620 11/06/14 0016  WBC 11.5* 12.0*  HGB 12.7* 13.9  HCT 39.1 40.9  PLT 117* 115*   BMET  Recent Labs  11/05/14 0620 11/06/14 0016  NA 135 138  K 3.2* 4.2  CL 100* 105  CO2 27 22  GLUCOSE 113* 96  BUN 6 6  CREATININE 0.87 0.82  CALCIUM 7.6* 8.4*   PT/INR  Recent Labs  11/04/14 1426  LABPROT 14.2  INR 1.08   CMP     Component Value Date/Time   NA 138 11/06/2014 0016   NA 134* 12/19/2012 0421   K 4.2 11/06/2014 0016   K 3.4* 05/11/2013 1024   CL 105 11/06/2014 0016   CL 101 12/19/2012 0421   CO2 22 11/06/2014 0016   CO2 28 12/19/2012 0421   GLUCOSE 96 11/06/2014 0016   GLUCOSE 102* 12/19/2012 0421   BUN 6 11/06/2014 0016   BUN 8 12/19/2012 0421   CREATININE 0.82 11/06/2014 0016   CREATININE 0.85 12/19/2012 0421   CALCIUM 8.4* 11/06/2014 0016   CALCIUM 8.4* 12/19/2012 0421   PROT 6.1* 11/06/2014 0016   PROT 7.1 12/17/2012 0951   ALBUMIN 3.2* 11/06/2014 0016   ALBUMIN 4.0 12/17/2012 0951   AST 40 11/06/2014 0016   AST 26 12/17/2012 0951   ALT 90* 11/06/2014 0016   ALT 31 12/17/2012 0951   ALKPHOS 107 11/06/2014 0016   ALKPHOS 76 12/17/2012 0951   BILITOT 1.3* 11/06/2014 0016   BILITOT 0.6 12/17/2012 0951   GFRNONAA >60 11/06/2014 0016   GFRNONAA >60 12/19/2012 0421   GFRAA >60 11/06/2014 0016    GFRAA >60 12/19/2012 0421   Lipase  No results found for: LIPASE     Studies/Results: Dg Ercp Biliary & Pancreatic Ducts  11/05/2014  CLINICAL DATA:  Cholelithiasis and choledocholithiasis. Elevated liver function tests. Fever. EXAM: ERCP TECHNIQUE: Multiple spot images obtained with the fluoroscopic device and submitted for interpretation post-procedure. FLUOROSCOPY TIME:  Fluoroscopy Time:  1 minutes 52 seconds Number of Acquired Images:  1 COMPARISON:  None. FINDINGS: Single fluoroscopic image from an ERCP shows contrast opacification and guidewire within the common bile and common hepatic ducts. At least 2 rounded filling defects are seen within the distal common bile duct, consistent with choledocholithiasis. Mild dilatation of the distal common bile duct is demonstrated. IMPRESSION: Mild dilatation of distal common bile duct, with at least 2 distal common duct calculi. These images were submitted for radiologic interpretation only. Please see the procedural report for the amount of contrast and the fluoroscopy time utilized. Electronically Signed   By: Myles RosenthalJohn  Stahl M.D.   On: 11/05/2014 10:54    Anti-infectives: Anti-infectives    Start     Dose/Rate Route Frequency Ordered Stop   11/05/14 0300  vancomycin (VANCOCIN) IVPB 1000 mg/200 mL premix  1,000 mg 200 mL/hr over 60 Minutes Intravenous Every 12 hours 11/04/14 2209     11/04/14 1400  piperacillin-tazobactam (ZOSYN) IVPB 3.375 g     3.375 g 12.5 mL/hr over 240 Minutes Intravenous 3 times per day 11/04/14 1323     11/04/14 1330  piperacillin-tazobactam (ZOSYN) IVPB 3.375 g  Status:  Discontinued     3.375 g 100 mL/hr over 30 Minutes Intravenous  Once 11/04/14 1304 11/04/14 1323   11/04/14 1330  vancomycin (VANCOCIN) IVPB 1000 mg/200 mL premix     1,000 mg 200 mL/hr over 60 Minutes Intravenous  Once 11/04/14 1304 11/04/14 1459       Assessment/Plan  1. S/p ERCP for choledocholithiasis, ? Cholangitis -plan for lap chole  hopefully tomorrow -low fat diet today, NPO p MN tonight  -d/w patient and he understands timing and plans.   LOS: 2 days    Tatayana Beshears E 11/06/2014, 9:30 AM Pager: [phone]

## 2014-11-07 ENCOUNTER — Inpatient Hospital Stay (HOSPITAL_COMMUNITY): Payer: Medicare Other | Admitting: Anesthesiology

## 2014-11-07 ENCOUNTER — Encounter (HOSPITAL_COMMUNITY): Payer: Self-pay | Admitting: Anesthesiology

## 2014-11-07 ENCOUNTER — Encounter (HOSPITAL_COMMUNITY)
Admission: AD | Disposition: A | Discharge status: Home / Self Care | Payer: Self-pay | Source: Other Acute Inpatient Hospital | Attending: Internal Medicine

## 2014-11-07 HISTORY — PX: CHOLECYSTECTOMY: SHX55

## 2014-11-07 SURGERY — LAPAROSCOPIC CHOLECYSTECTOMY
Anesthesia: General | Site: Abdomen

## 2014-11-07 MED ORDER — PROPOFOL 10 MG/ML IV BOLUS
INTRAVENOUS | Status: AC
  Filled 2014-11-07: qty 20

## 2014-11-07 MED ORDER — FENTANYL CITRATE (PF) 100 MCG/2ML IJ SOLN
INTRAMUSCULAR | Status: DC | PRN
  Administered 2014-11-07 (×5): 50 ug via INTRAVENOUS

## 2014-11-07 MED ORDER — FENTANYL CITRATE (PF) 250 MCG/5ML IJ SOLN
INTRAMUSCULAR | Status: AC
  Filled 2014-11-07: qty 5

## 2014-11-07 MED ORDER — ONDANSETRON HCL 4 MG/2ML IJ SOLN: 4.0000 mg | Freq: Once | INTRAMUSCULAR | Status: DC | PRN

## 2014-11-07 MED ORDER — GLYCOPYRROLATE 0.2 MG/ML IJ SOLN
INTRAMUSCULAR | Status: AC
  Filled 2014-11-07: qty 1

## 2014-11-07 MED ORDER — BUPIVACAINE-EPINEPHRINE 0.25% -1:200000 IJ SOLN
INTRAMUSCULAR | Status: DC | PRN
  Administered 2014-11-07: 20 mL

## 2014-11-07 MED ORDER — ONDANSETRON HCL 4 MG/2ML IJ SOLN
INTRAMUSCULAR | Status: DC | PRN
  Administered 2014-11-07: 4 mg via INTRAVENOUS

## 2014-11-07 MED ORDER — LACTATED RINGERS IV SOLN
INTRAVENOUS | Status: DC
  Administered 2014-11-07: 12:00:00 via INTRAVENOUS

## 2014-11-07 MED ORDER — OXYCODONE HCL 5 MG/5ML PO SOLN: 5.0000 mg | Freq: Once | ORAL | Status: AC | PRN

## 2014-11-07 MED ORDER — ROCURONIUM BROMIDE 50 MG/5ML IV SOLN
INTRAVENOUS | Status: AC
  Filled 2014-11-07: qty 1

## 2014-11-07 MED ORDER — LIDOCAINE HCL (CARDIAC) 20 MG/ML IV SOLN
INTRAVENOUS | Status: DC | PRN
  Administered 2014-11-07: 40 mg via INTRAVENOUS

## 2014-11-07 MED ORDER — SUCCINYLCHOLINE CHLORIDE 20 MG/ML IJ SOLN
INTRAMUSCULAR | Status: AC
  Filled 2014-11-07: qty 1

## 2014-11-07 MED ORDER — SODIUM CHLORIDE 0.9 % IR SOLN
Status: DC | PRN
  Administered 2014-11-07: 1000 mL

## 2014-11-07 MED ORDER — SUGAMMADEX SODIUM 500 MG/5ML IV SOLN
INTRAVENOUS | Status: DC | PRN
  Administered 2014-11-07: 350 mg via INTRAVENOUS

## 2014-11-07 MED ORDER — BUPIVACAINE-EPINEPHRINE (PF) 0.25% -1:200000 IJ SOLN
INTRAMUSCULAR | Status: AC
  Filled 2014-11-07: qty 30

## 2014-11-07 MED ORDER — 0.9 % SODIUM CHLORIDE (POUR BTL) OPTIME
TOPICAL | Status: DC | PRN
  Administered 2014-11-07: 1000 mL

## 2014-11-07 MED ORDER — FENTANYL CITRATE (PF) 100 MCG/2ML IJ SOLN
25.0000 ug | INTRAMUSCULAR | Status: DC | PRN
  Administered 2014-11-07 (×2): 50 ug via INTRAVENOUS

## 2014-11-07 MED ORDER — GLYCOPYRROLATE 0.2 MG/ML IJ SOLN
INTRAMUSCULAR | Status: AC
  Filled 2014-11-07: qty 3

## 2014-11-07 MED ORDER — FENTANYL CITRATE (PF) 100 MCG/2ML IJ SOLN
INTRAMUSCULAR | Status: AC
  Filled 2014-11-07: qty 2

## 2014-11-07 MED ORDER — ROCURONIUM BROMIDE 100 MG/10ML IV SOLN
INTRAVENOUS | Status: DC | PRN
Start: 2014-11-07 — End: 2014-11-07
  Administered 2014-11-07: 40 mg via INTRAVENOUS

## 2014-11-07 MED ORDER — ONDANSETRON HCL 4 MG/2ML IJ SOLN
INTRAMUSCULAR | Status: AC
  Filled 2014-11-07: qty 2

## 2014-11-07 MED ORDER — PROPOFOL 10 MG/ML IV BOLUS
INTRAVENOUS | Status: DC | PRN
  Administered 2014-11-07: 150 mg via INTRAVENOUS

## 2014-11-07 MED ORDER — SUGAMMADEX SODIUM 500 MG/5ML IV SOLN
INTRAVENOUS | Status: AC
  Filled 2014-11-07: qty 5

## 2014-11-07 MED ORDER — HYDROCODONE-ACETAMINOPHEN 5-325 MG PO TABS: 1.0000 | ORAL_TABLET | ORAL | Status: DC | PRN

## 2014-11-07 MED ORDER — LACTATED RINGERS IV SOLN: INTRAVENOUS | Status: DC | PRN

## 2014-11-07 MED ORDER — LACTATED RINGERS IV SOLN
INTRAVENOUS | Status: DC | PRN
  Administered 2014-11-07: 13:00:00 via INTRAVENOUS

## 2014-11-07 MED ORDER — PIPERACILLIN-TAZOBACTAM 3.375 G IVPB
3.3750 g | INTRAVENOUS | Status: DC
  Filled 2014-11-07: qty 50

## 2014-11-07 MED ORDER — OXYCODONE HCL 5 MG PO TABS
5.0000 mg | ORAL_TABLET | Freq: Once | ORAL | Status: AC | PRN
  Administered 2014-11-07: 5 mg via ORAL

## 2014-11-07 MED ORDER — HYDROMORPHONE HCL 1 MG/ML IJ SOLN: 1.0000 mg | INTRAMUSCULAR | Status: DC | PRN

## 2014-11-07 MED ORDER — GLYCOPYRROLATE 0.2 MG/ML IJ SOLN
INTRAMUSCULAR | Status: DC | PRN
  Administered 2014-11-07: 0.2 mg via INTRAVENOUS

## 2014-11-07 MED ORDER — CHLORHEXIDINE GLUCONATE CLOTH 2 % EX PADS
6.0000 | MEDICATED_PAD | Freq: Every day | CUTANEOUS | Status: DC
  Administered 2014-11-07: 6 via TOPICAL

## 2014-11-07 MED ORDER — OXYCODONE HCL 5 MG PO TABS
ORAL_TABLET | ORAL | Status: AC
  Filled 2014-11-07: qty 1

## 2014-11-07 MED ORDER — LIDOCAINE HCL (CARDIAC) 20 MG/ML IV SOLN
INTRAVENOUS | Status: AC
  Filled 2014-11-07: qty 5

## 2014-11-07 MED ORDER — MUPIROCIN 2 % EX OINT
1.0000 "application " | TOPICAL_OINTMENT | Freq: Two times a day (BID) | CUTANEOUS | Status: DC
  Administered 2014-11-07 – 2014-11-08 (×4): 1 via NASAL
  Filled 2014-11-07 (×2): qty 22

## 2014-11-07 SURGICAL SUPPLY — 37 items
APPLIER CLIP 5 13 M/L LIGAMAX5 (MISCELLANEOUS) ×3
CANISTER SUCTION 2500CC (MISCELLANEOUS) ×3 IMPLANT
CHLORAPREP W/TINT 26ML (MISCELLANEOUS) ×3 IMPLANT
CLIP APPLIE 5 13 M/L LIGAMAX5 (MISCELLANEOUS) ×1 IMPLANT
COVER SURGICAL LIGHT HANDLE (MISCELLANEOUS) ×3 IMPLANT
ELECT REM PT RETURN 9FT ADLT (ELECTROSURGICAL) ×3
ELECTRODE REM PT RTRN 9FT ADLT (ELECTROSURGICAL) ×1 IMPLANT
GLOVE BIO SURGEON STRL SZ7 (GLOVE) ×3 IMPLANT
GLOVE BIO SURGEON STRL SZ7.5 (GLOVE) ×3 IMPLANT
GLOVE BIOGEL PI IND STRL 6.5 (GLOVE) ×1 IMPLANT
GLOVE BIOGEL PI IND STRL 7.0 (GLOVE) ×1 IMPLANT
GLOVE BIOGEL PI IND STRL 7.5 (GLOVE) ×1 IMPLANT
GLOVE BIOGEL PI INDICATOR 6.5 (GLOVE) ×2
GLOVE BIOGEL PI INDICATOR 7.0 (GLOVE) ×2
GLOVE BIOGEL PI INDICATOR 7.5 (GLOVE) ×2
GLOVE SURG SIGNA 7.5 PF LTX (GLOVE) ×3 IMPLANT
GOWN STRL REUS W/ TWL LRG LVL3 (GOWN DISPOSABLE) ×2 IMPLANT
GOWN STRL REUS W/ TWL XL LVL3 (GOWN DISPOSABLE) ×1 IMPLANT
GOWN STRL REUS W/TWL LRG LVL3 (GOWN DISPOSABLE) ×4
GOWN STRL REUS W/TWL XL LVL3 (GOWN DISPOSABLE) ×2
KIT BASIN OR (CUSTOM PROCEDURE TRAY) ×3 IMPLANT
KIT ROOM TURNOVER OR (KITS) ×3 IMPLANT
LIQUID BAND (GAUZE/BANDAGES/DRESSINGS) ×3 IMPLANT
NS IRRIG 1000ML POUR BTL (IV SOLUTION) ×3 IMPLANT
PAD ARMBOARD 7.5X6 YLW CONV (MISCELLANEOUS) ×3 IMPLANT
POUCH SPECIMEN RETRIEVAL 10MM (ENDOMECHANICALS) ×3 IMPLANT
SCISSORS LAP 5X35 DISP (ENDOMECHANICALS) ×3 IMPLANT
SET IRRIG TUBING LAPAROSCOPIC (IRRIGATION / IRRIGATOR) ×3 IMPLANT
SLEEVE ENDOPATH XCEL 5M (ENDOMECHANICALS) ×6 IMPLANT
SPECIMEN JAR SMALL (MISCELLANEOUS) ×3 IMPLANT
SUT MON AB 4-0 PC3 18 (SUTURE) ×3 IMPLANT
TOWEL OR 17X24 6PK STRL BLUE (TOWEL DISPOSABLE) ×3 IMPLANT
TOWEL OR 17X26 10 PK STRL BLUE (TOWEL DISPOSABLE) ×3 IMPLANT
TRAY LAPAROSCOPIC MC (CUSTOM PROCEDURE TRAY) ×3 IMPLANT
TROCAR XCEL BLUNT TIP 100MML (ENDOMECHANICALS) ×3 IMPLANT
TROCAR XCEL NON-BLD 5MMX100MML (ENDOMECHANICALS) ×3 IMPLANT
TUBING INSUFFLATION (TUBING) ×3 IMPLANT

## 2014-11-07 NOTE — Care Management Important Message (Signed)
Important Message  Patient Details  Name: Jesus White MRN: [id number] Date of Birth: [birth year]   Medicare Important Message Given:  Yes-second notification given    Kyla BalzarineShealy, Navid Lenzen Abena 11/07/2014, 1:05 PM

## 2014-11-07 NOTE — Anesthesia Preprocedure Evaluation (Addendum)
Anesthesia Evaluation  Patient identified by MRN, date of birth, ID band Patient awake    Reviewed: Allergy & Precautions, NPO status , Patient's Chart, lab work & pertinent test results  Airway Mallampati: III  TM Distance: >3 FB     Dental  (+) Teeth Intact, Dental Advisory Given   Pulmonary    breath sounds clear to auscultation- rhonchi       Cardiovascular hypertension, Pt. on medications  Rhythm:Regular Rate:Normal     Neuro/Psych Anxiety    GI/Hepatic   Endo/Other    Renal/GU      Musculoskeletal   Abdominal   Peds  Hematology   Anesthesia Other Findings   Reproductive/Obstetrics                            Anesthesia Physical Anesthesia Plan  ASA: III  Anesthesia Plan: General   Post-op Pain Management:    Induction: Intravenous  Airway Management Planned: Oral ETT  Additional Equipment:   Intra-op Plan:   Post-operative Plan:   Informed Consent: I have reviewed the patients History and Physical, chart, labs and discussed the procedure including the risks, benefits and alternatives for the proposed anesthesia with the patient or authorized representative who has indicated his/her understanding and acceptance.   Dental advisory given  Plan Discussed with: CRNA, Anesthesiologist and Surgeon  Anesthesia Plan Comments:        Anesthesia Quick Evaluation

## 2014-11-07 NOTE — Anesthesia Procedure Notes (Signed)
**Note De-White via Obfuscation** Procedure Name: Intubation Date/Time: 11/07/2014 1:25 PM Performed by: Jesus White, Jesus White, Timeout performed, Emergency Drugs available, Suction available and Patient being monitored Patient Re-evaluated:Patient Re-evaluated prior to inductionOxygen Delivery Method: Circle system utilized Preoxygenation: Pre-oxygenation with 100% oxygen Intubation Type: IV induction Ventilation: Mask ventilation without difficulty and Oral airway inserted - appropriate to patient size Laryngoscope Size: Mac and 4 Grade View: Grade I Tube type: Oral Tube size: 7.5 mm Number of attempts: 1 Airway Equipment and Method: Stylet and Oral airway Placement Confirmation: ETT inserted through vocal cords under direct vision,  positive ETCO2 and breath sounds checked- equal and bilateral Secured at: 22 cm Tube secured with: Tape Dental Injury: Teeth and Oropharynx as per pre-operative assessment

## 2014-11-07 NOTE — Care Management Note (Signed)
Case Management Note  Patient Details  Name: Sherryle LisDarryl H Alperin MRN: [id number] Date of Birth: [birth year]  Subjective/Objective:   Date: 11/07/14 Spoke with patient at the bedside . Introduced self as Sports coachcase manager and explained role in discharge planning and how to be reached. [place] ,. Expressed no potential need for no other DME. Verified patient anticipates to go home with family,  at time of discharge and will have  part-time supervision by family  at this time to best of their knowledge. Patient denied needing help with their medication. Patient drives  to MD appointments. Verified patient has PCP [facility].  Will be transported by wife at dc.    Plan: CM will continue to follow for discharge planning and [facility] resources.                  Action/Plan:   Expected Discharge Date:                  Expected Discharge Plan:  Home w Home Health Services  In-House Referral:     Discharge planning Services  CM Consult  Post Acute Care Choice:    Choice offered to:     DME Arranged:    DME Agency:     HH Arranged:    HH Agency:     Status of Service:  In process, will continue to follow  Medicare Important Message Given:  Yes-second notification given Date Medicare IM Given:    Medicare IM give by:    Date Additional Medicare IM Given:    Additional Medicare Important Message give by:     If discussed at Long Length of Stay Meetings, dates discussed:    Additional Comments:  Leone Havenaylor, Akshar Starnes Clinton, RN 11/07/2014, 4:53 PM

## 2014-11-07 NOTE — Op Note (Signed)
Laparoscopic Cholecystectomy Procedure Note  Indications: This patient presents with symptomatic gallbladder disease and will undergo laparoscopic cholecystectomy.  He had a preop ERCP with stone removal.  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 The risks, benefits, complications, treatment options, and expected outcomes were discussed with the patient. The possibilities of reaction to medication, pulmonary aspiration, perforation of viscus, bleeding, recurrent infection, finding a normal gallbladder, the need for additional procedures, failure to diagnose a condition, the possible need to convert to an open procedure, and creating a complication requiring transfusion or operation were discussed with the patient. The likelihood of improving the patient's symptoms with return to their baseline status is good.  The patient and/or family concurred with the proposed plan, giving informed consent. The site of surgery properly noted. The patient was taken to Operating Room, identified as Sherryle Lisarryl H Roulhac and the procedure verified as Laparoscopic Cholecystectomy with Intraoperative Cholangiogram. A Time Out was held and the above information confirmed.  Prior to the induction of general anesthesia, antibiotic prophylaxis was administered. General endotracheal anesthesia was then administered and tolerated well. After the induction, the abdomen was prepped with Chloraprep and draped in sterile fashion. The patient was positioned in the supine position.  Local anesthetic agent was injected into the skin near the umbilicus and an incision made. We dissected down to the abdominal fascia with blunt dissection.  The fascia was  incised vertically and we entered the peritoneal cavity bluntly.  A pursestring suture of 0-Vicryl was placed around the fascial opening.  The Hasson cannula was inserted and secured with the stay suture.  Pneumoperitoneum was then created with CO2 and tolerated well without any adverse changes in the patient's vital signs. A 5-mm port was placed in the subxiphoid position.  Two 5-mm ports were placed in the right upper quadrant. All skin incisions were infiltrated with a local anesthetic agent before making the incision and placing the trocars.   We positioned the patient in reverse Trendelenburg, tilted slightly to the patient's left.  The gallbladder was identified, the fundus grasped and retracted cephalad. Adhesions were lysed bluntly and with the electrocautery where indicated, taking care not to injure any adjacent organs or viscus. The infundibulum was grasped and retracted laterally, exposing the peritoneum overlying the triangle of Calot. This was then divided and exposed in a blunt fashion. The cystic duct was clearly identified and bluntly dissected circumferentially. A critical view of the cystic duct and cystic artery was obtained.  The cystic duct was then ligated with clips and divided. The cystic artery was, dissected free, ligated with clips and divided as well.   The gallbladder was dissected from the liver bed in retrograde fashion with the electrocautery. The gallbladder was removed and placed in an Endocatch sac. The liver bed was irrigated and inspected. Hemostasis was achieved with the electrocautery. Copious irrigation was utilized and was repeatedly aspirated until clear.  The gallbladder and Endocatch sac were then removed through the umbilical port site.  The pursestring suture was used to close the umbilical fascia.    We again inspected the right upper quadrant for hemostasis.  Pneumoperitoneum was released as we removed the trocars.  4-0 Monocryl was used to close the skin.    Skin glue was then applied. The  patient was then extubated and brought to the recovery room in stable condition. Instrument, sponge, and needle counts were correct at closure and at the conclusion of the case.   Findings: Cholecystitis with Cholelithiasis  Estimated Blood Loss: Minimal         Drains: 0         Specimens: Gallbladder           Complications: None; patient tolerated the procedure well.         Disposition: PACU - hemodynamically stable.         Condition: stable

## 2014-11-07 NOTE — Progress Notes (Signed)
ANTIBIOTIC CONSULT NOTE - FOLLOW UP  Pharmacy Consult for Vancomycin and Zosyn Indication: rule out sepsis  No Known Allergies  Patient Measurements: Height: 6' (182.9 cm) Weight: 191 lb 12.8 oz (87 kg) IBW/kg (Calculated) : 77.6  Vital Signs: Temp: 97.7 F (36.5 C) (10/18 1451) Temp Source: Oral (10/18 0603) BP: 145/86 mmHg (10/18 1525) Pulse Rate: 88 (10/18 1525) Intake/Output from previous day: 10/17 0701 - 10/18 0700 In: 2493.3 [P.O.:260; I.V.:1583.3; IV Piggyback:650] Out: -   Labs:  Recent Labs  11/05/14 0620 11/06/14 0016  WBC 11.5* 12.0*  HGB 12.7* 13.9  PLT 117* 115*  CREATININE 0.87 0.82   Estimated Creatinine Clearance: 92 mL/min (by C-G formula based on Cr of 0.82).  Assessment:   Day # 4 Vanc and Zosyn for sepsis/abdominal coverage.   Urine culture negative, blood cultures negative to date.   Afebrile, WBC 12.0, renal function stable.   S/p laparoscopic cholecystectomy.  No drain. Zosyn dose given in OR.  Goal of Therapy:  Vancomycin trough level 15-20 mcg/ml appropriate Zosyn dose for renal function and infection  Plan:   Continue Vancomycin 1 gram IV q12hrs.  Continue Zosyn 3.375 gm IV q8hrs (each over 4 hrs)  Will follow-up for duration of antibiotic course; expect ~24 hrs post-op.  Dennie FettersEgan, Laythan Hayter Donovan, RPh Pager: [phone] 11/07/2014,3:48 PM

## 2014-11-07 NOTE — Transfer of Care (Signed)
Immediate Anesthesia Transfer of Care Note  Patient: Jesus White  Procedure(s) Performed: Procedure(s): LAPAROSCOPIC CHOLECYSTECTOMY (N/A)  Patient Location: PACU  Anesthesia Type:General  Level of Consciousness: awake, alert  and oriented  Airway & Oxygen Therapy: Patient Spontanous Breathing and Patient connected to nasal cannula oxygen  Post-op Assessment: Report given to RN and Post -op Vital signs reviewed and stable  Post vital signs: Reviewed and stable  Last Vitals:  Filed Vitals:   11/07/14 0603  BP: 151/76  Pulse: 72  Temp: 36.8 C  Resp: 18    Complications: No apparent anesthesia complications

## 2014-11-07 NOTE — Progress Notes (Signed)
[facility]                                                                              Patient Demographics  Jesus White, is a 69 y.o. male, DOB - [birth year], ZOX:[id number]  Admit date - 11/04/2014   Admitting Physician Clydie Braun, MD  Outpatient Primary MD for the patient is Marisue Ivan, MD  LOS - 3   No chief complaint on file.      Brief HPI   Per Dr. Katrinka Blazing on 11/04/14 Jesus White is a 69 y.o. male with a past medical history that includes hypertension, hyperlipidemia, anxiety presents to room 34 on 5 W. at [facility] hospital from the emergency department at [facility] with fever, tachycardia and acute encephalopathy. Initial workup at [facility] includes CT of the abdomen pelvis concerning for choledocholithiasis with cholelithiasis. He was transferred to [place] is GI services were not immediately available. History was obtained from the patient who reported that he was having chills, feeling "feverish" yesterday worsening in the evening to the point he could not "stop shaking" he reports his next memory is waking up in the [facility] emergency department. Chart indicates patient was brought to the hospital as his wife found him unresponsive early this morning. EMS reports when they arrived he had an axillary temp of 102.8. It is noted that he was given 500 mils of normal saline in and route he became more alert but remained confused. He was provided with cooling packs. Wife reported patient did complain of some abdominal pain the prior day. Wife indicated at 4am patient restless in bed and she believes he had emesis. No report of diarrhea. She did indicate he was not eating or drinking as he normally does. Upon arrival to [facility] ED, patient had fever, tachycardia and was confused. code sepsis was called patient was given 3 L of normal saline he was also provided with vancomycin and Zosyn for an unknown infection. Additionally he  received Tylenol rectally. Chest x-ray was unremarkable, urinalysis unremarkable, CT of the abdomen concerning for choledoholithiasis with cholelithiasis. Lactic acid 6.2 and within normal limits upon arrival to [facility]. Chart indicates gastroenterology not available so patient was transferred and Dr. Madilyn Fireman with [facility] gastroenterology will consult.    Assessment & Plan   Principal Problem:  SIRS (systemic inflammatory response syndrome) (HCC), cholangitis, transaminitis:  - CT abdomen showed cholelithiasis and choledocholithiasis, ill-defined 1.5 x 1 cm hypovascular lesion in the right lobe of the liver, recommended nonemergent MRI  - Gastroenterology consulted, ERCP showed single ovoid common bile duct stones removed after sphincterotomy, surgery consulted -  continue IV fluid hydration, cholecystectomy 10/18 - Continue IV broad-spectrum antibiotics, blood cultures and urine culture negative so far   Active Problems: Acute encephalopathy: Related to above, currently at baseline. -  Much improved after fluid resuscitation. CT of the head with no acute abnormalities.   Lactic acidosis: Secondary to #1 - Lactic acid level within the limits of normal 3 hours after fluid resuscitation  Hypertension: - Currently stable monitor closely   Hyperlipidemia: No statins can do to transaminitis   Liver lesion: Incidental finding  per CT. Outpatient follow-up with MRI   Coronary atherosclerosis: Per CT. Patient denies history of same. No chest pain. Outpatient follow-up   Code Status: Full code  Family Communication: Discussed in detail with the patient, all imaging results, lab results explained to the patient    Disposition Plan: hopefully DC next 24-48 hours  Time Spent in minutes   15 minutes  Procedures  ERCP  Consults   Gastroenterology  DVT Prophylaxis Lovenox  Medications  Scheduled Meds: . [MAR Hold] antiseptic oral rinse  7 mL Mouth Rinse BID  . [MAR Hold]  Chlorhexidine Gluconate Cloth  6 each Topical Daily  . fentaNYL      . [MAR Hold] mupirocin ointment  1 application Nasal BID  . oxyCODONE      . [MAR Hold] piperacillin-tazobactam (ZOSYN)  IV  3.375 g Intravenous 3 times per day  . piperacillin-tazobactam (ZOSYN)  IV  3.375 g Intravenous To OR  . [MAR Hold] sodium chloride  3 mL Intravenous Q12H  . [MAR Hold] vancomycin  1,000 mg Intravenous Q12H   Continuous Infusions: . sodium chloride 125 mL/hr at 11/07/14 0359  . lactated ringers 50 mL/hr at 11/07/14 1217   PRN Meds:.[MAR Hold] acetaminophen **OR** [MAR Hold] acetaminophen, fentaNYL (SUBLIMAZE) injection, [MAR Hold]  HYDROmorphone (DILAUDID) injection, [MAR Hold] ondansetron **OR** [MAR Hold] ondansetron (ZOFRAN) IV, ondansetron (ZOFRAN) IV   Antibiotics   Anti-infectives    Start     Dose/Rate Route Frequency Ordered Stop   11/07/14 1245  piperacillin-tazobactam (ZOSYN) IVPB 3.375 g     3.375 g 12.5 mL/hr over 240 Minutes Intravenous To Surgery 11/07/14 1245 11/08/14 1300   11/05/14 0300  [MAR Hold]  vancomycin (VANCOCIN) IVPB 1000 mg/200 mL premix     (MAR Hold since 11/07/14 1203)   1,000 mg 200 mL/hr over 60 Minutes Intravenous Every 12 hours 11/04/14 2209     11/04/14 1400  [MAR Hold]  piperacillin-tazobactam (ZOSYN) IVPB 3.375 g     (MAR Hold since 11/07/14 1203)   3.375 g 12.5 mL/hr over 240 Minutes Intravenous 3 times per day 11/04/14 1323     11/04/14 1330  piperacillin-tazobactam (ZOSYN) IVPB 3.375 g  Status:  Discontinued     3.375 g 100 mL/hr over 30 Minutes Intravenous  Once 11/04/14 1304 11/04/14 1323   11/04/14 1330  vancomycin (VANCOCIN) IVPB 1000 mg/200 mL premix     1,000 mg 200 mL/hr over 60 Minutes Intravenous  Once 11/04/14 1304 11/04/14 1459        Subjective:   Jesus White was seen and examined today.   Patient seen prior to surgery, no complaints.. Patient denies dizziness, chest pain, shortness of breath,  abdominal pain, nausea or vomiting,  new weakness, numbess, tingling. No acute events overnight.    Objective:   Blood pressure 171/92, pulse 100, temperature 97.2 F (36.2 C), temperature source Oral, resp. rate 22, height 6' (1.829 m), weight 87 kg (191 lb 12.8 oz), SpO2 98 %.  Wt Readings from Last 3 Encounters:  11/04/14 87 kg (191 lb 12.8 oz)  11/04/14 85.14 kg (187 lb 11.2 oz)     Intake/Output Summary (Last 24 hours) at 11/07/14 1443 Last data filed at 11/07/14 1400  Gross per 24 hour  Intake 2793.33 ml  Output      5 ml  Net 2788.33 ml    Exam  General: Alert and oriented x 3, NAD  HEENT:  PERRLA, EOMI, Anicteric Sclera, mucous membranes moist.   Neck: Supple, no JVD,  no masses  CVS: S1 S2 clear, RRR  Respiratory: CTAB  Abdomen: Soft, NT, ND, NBS  Ext: no cyanosis clubbing or edema  Neuro: no new deficits  Skin: No rashes  Psych: Normal affect and demeanor, alert and oriented x3    Data Review   Micro Results Recent Results (from the past 240 hour(s))  Blood Culture (routine x 2)     Status: None (Preliminary result)   Collection Time: 11/04/14  6:23 AM  Result Value Ref Range Status   Specimen Description BLOOD LEFT HAND  Final   Special Requests BOTTLES DRAWN AEROBIC AND ANAEROBIC 4CC  Final   Culture NO GROWTH 3 DAYS  Final   Report Status PENDING  Incomplete  Blood Culture (routine x 2)     Status: None (Preliminary result)   Collection Time: 11/04/14  6:35 AM  Result Value Ref Range Status   Specimen Description BLOOD LEFT ARM  Final   Special Requests BOTTLES DRAWN AEROBIC AND ANAEROBIC 4CC  Final   Culture NO GROWTH 3 DAYS  Final   Report Status PENDING  Incomplete  Urine culture     Status: None   Collection Time: 11/04/14  6:50 AM  Result Value Ref Range Status   Specimen Description URINE, RANDOM  Final   Special Requests NONE  Final   Culture NO GROWTH 2 DAYS  Final   Report Status 11/06/2014 FINAL  Final  Culture, blood (x 2)     Status: None (Preliminary result)    Collection Time: 11/04/14  2:26 PM  Result Value Ref Range Status   Specimen Description BLOOD RIGHT HAND  Final   Special Requests BOTTLES DRAWN AEROBIC AND ANAEROBIC 10CC  Final   Culture NO GROWTH 3 DAYS  Final   Report Status PENDING  Incomplete  Culture, blood (x 2)     Status: None (Preliminary result)   Collection Time: 11/04/14  2:34 PM  Result Value Ref Range Status   Specimen Description BLOOD LEFT HAND  Final   Special Requests BOTTLES DRAWN AEROBIC AND ANAEROBIC 10CC  Final   Culture NO GROWTH 3 DAYS  Final   Report Status PENDING  Incomplete  MRSA PCR Screening     Status: None   Collection Time: 11/05/14  3:30 AM  Result Value Ref Range Status   MRSA by PCR NEGATIVE NEGATIVE Final    Comment:        The GeneXpert MRSA Assay (FDA approved for NASAL specimens only), is one component of a comprehensive MRSA colonization surveillance program. It is not intended to diagnose MRSA infection nor to guide or monitor treatment for MRSA infections.   Surgical pcr screen     Status: Abnormal   Collection Time: 11/06/14  9:32 PM  Result Value Ref Range Status   MRSA, PCR NEGATIVE NEGATIVE Final   Staphylococcus aureus POSITIVE (A) NEGATIVE Final    Comment:        The Xpert SA Assay (FDA approved for NASAL specimens in patients over 221 years of age), is one component of a comprehensive surveillance program.  Test performance has been validated by [facility] for patients greater than or equal to 70 year old. It is not intended to diagnose infection nor to guide or monitor treatment.     Radiology Reports Ct Head Wo Contrast  11/04/2014  CLINICAL DATA:  69 year old male found at 4 a.m. to be diaphoretic and unresponsive. Previously normal mental status. Febrile with temperature of 102.8 degrees. EXAM: CT HEAD  WITHOUT CONTRAST TECHNIQUE: Contiguous axial images were obtained from the base of the skull through the vertex without intravenous contrast. COMPARISON:   No priors. FINDINGS: Patchy areas of mild decreased attenuation are noted throughout the deep and periventricular white matter of the cerebral hemispheres bilaterally, compatible with mild chronic microvascular ischemic disease. No acute intracranial abnormalities. Specifically, no evidence of acute intracranial hemorrhage, no definite findings of acute/subacute cerebral ischemia, no mass, mass effect, hydrocephalus or abnormal intra or extra-axial fluid collections. Visualized paranasal sinuses and mastoids are well pneumatized. No acute displaced skull fractures are identified. IMPRESSION: 1. No acute intracranial abnormalities. 2. Mild chronic microvascular ischemic changes in cerebral white matter, as above. Electronically Signed   By: Trudie Reed M.D.   On: 11/04/2014 07:58   Ct Chest Wo Contrast  11/04/2014  CLINICAL DATA:  69 year old male reportedly at baseline mental status yesterday evening found unresponsive and diaphoretic at 4 a.m. this morning. Febrile with axillary temperature of 102.8 degrees. Altered mental status. EXAM: CT CHEST WITHOUT CONTRAST, AND CTA ABDOMEN AND PELVIS WITH CONTRAST TECHNIQUE: Multidetector CT imaging of the chest was performed following the standard protocol without IV contrast. Multidetector CT imaging of the abdomen and pelvis was performed using the standard protocol during bolus administration of intravenous contrast. Multiplanar reconstructed images and MIPs were obtained and reviewed to evaluate the vascular anatomy. CONTRAST:  OMNIPAQUE IOHEXOL 350 MG/ML SOLN COMPARISON:  No priors. FINDINGS: CT CHEST FINDINGS Mediastinum/Lymph Nodes: Heart size is normal. There is no significant pericardial fluid, thickening or pericardial calcification. There is atherosclerosis of the thoracic aorta, the great vessels of the mediastinum and the coronary arteries, including calcified atherosclerotic plaque in the left main, left anterior descending, left circumflex and  right coronary arteries. Calcifications of the aortic valve. No pathologically enlarged mediastinal or hilar lymph nodes. Please note that accurate exclusion of hilar adenopathy is limited on noncontrast CT scans. Esophagus is unremarkable in appearance. No axillary lymphadenopathy. Lungs/Pleura: Dependent atelectasis in the lower lobes of the lungs bilaterally. No acute consolidative airspace disease. No pleural effusions. No suspicious appearing pulmonary nodules or masses. Musculoskeletal/Soft Tissues: There are no aggressive appearing lytic or blastic lesions noted in the visualized portions of the skeleton. CTA ABDOMEN AND PELVIS FINDINGS Hepatobiliary: Mild diffuse decreased attenuation throughout the hepatic parenchyma. Multiple small calcified granulomas in the liver incidentally noted. Somewhat ill-defined ovoid shaped 1.5 x 1.0 cm hypovascular lesion between segments 5 and 6 in the liver (image 24 of series 6) is indeterminate. No other suspicious hepatic lesions are noted. Very mild periportal edema. No definite intra hepatic biliary ductal dilatation. There are some tiny high attenuation foci lying dependently in the neck of the gallbladder. In addition, in the mid common bile ducts (image 32 of series 6 and coronal image 61 of series 12) there is a 5 x 8 x 10 mm high attenuation focus, presumably a ductal stone. Proximal to this stone the common bile duct is normal in caliber measuring 7 mm in the porta hepatis. Distally, the duct dilates up to 9 mm just before the ampulla. Pancreas: No pancreatic ductal dilatation. No pancreatic mass. No pancreatic or peripancreatic fluid or inflammatory changes. Spleen: Unremarkable. Adrenals/Urinary Tract: Sub cm low-attenuation lesion in the lower pole of the left kidney is too small to characterize, but favored to represent a cyst. Right kidney is normal in appearance. Bilateral perinephric stranding (nonspecific). No hydroureteronephrosis. Urinary bladder is  normal in appearance. Bilateral adrenal glands are normal in appearance. Stomach/Bowel: Normal appearance of  the stomach. No pathologic dilatation of small bowel or colon. Numerous colonic diverticulae are noted, particularly in the sigmoid colon, without surrounding inflammatory changes to suggest an acute diverticulitis at this time. Normal appendix. Vascular/Lymphatic: Atherosclerosis throughout the abdominal and pelvic vasculature, without evidence of aneurysm or dissection. Celiac axis, superior mesenteric artery and inferior mesenteric artery and their major branches are all widely patent without definite hemodynamic significant stenosis. Single renal arteries bilaterally. No lymphadenopathy noted in the abdomen or pelvis. Reproductive: Prostate gland seminal vesicles are unremarkable in appearance. Other: No significant volume of ascites.  No pneumoperitoneum. Musculoskeletal: There are no aggressive appearing lytic or blastic lesions noted in the visualized portions of the skeleton. Review of the MIP images confirms the above findings. IMPRESSION: 1. Cholelithiasis and choledocholithiasis. No overt signs of biliary tract obstruction at this time, although it is unusual that the very distal common bile duct immediately before the ampulla is mildly dilated. There is some mild periportal edema in the right lobe of the liver which is nonspecific, but correlation with liver function tests is recommended. 2. Ill-defined 1.5 x 1.0 cm hypovascular lesion between segments 5 and 6 in the right lobe of the liver. This is nonspecific, and would require further evaluation with nonemergent MRI with and without IV gadolinium at some point in near future for more definitive characterization. 3. No acute findings in the thorax. 4. Atherosclerosis, including left main and 3 vessel coronary artery disease. Please note that although the presence of coronary artery calcium documents the presence of coronary artery disease, the  severity of this disease and any potential stenosis cannot be assessed on this non-gated CT examination. Assessment for potential risk factor modification, dietary therapy or pharmacologic therapy may be warranted, if clinically indicated. 5. Colonic diverticulosis without evidence of acute diverticulitis at this time. 6. Additional incidental findings, as above. Electronically Signed   By: Trudie Reed M.D.   On: 11/04/2014 08:35   Dg Chest Port 1 View  11/04/2014  CLINICAL DATA:  Patient was found unresponsive at 6 a.m. today. Fever. History of hypertension. EXAM: PORTABLE CHEST 1 VIEW COMPARISON:  None. FINDINGS: Shallow inspiration with atelectasis in the lung bases. Heart size and pulmonary vascularity are normal. No focal consolidation in the lungs. No blunting of costophrenic angles. No pneumothorax. Mediastinal contours appear intact. IMPRESSION: Shallow inspiration with atelectasis in the lung bases. Electronically Signed   By: Burman Nieves M.D.   On: 11/04/2014 06:51   Dg Ercp Biliary & Pancreatic Ducts  11/05/2014  CLINICAL DATA:  Cholelithiasis and choledocholithiasis. Elevated liver function tests. Fever. EXAM: ERCP TECHNIQUE: Multiple spot images obtained with the fluoroscopic device and submitted for interpretation post-procedure. FLUOROSCOPY TIME:  Fluoroscopy Time:  1 minutes 52 seconds Number of Acquired Images:  1 COMPARISON:  None. FINDINGS: Single fluoroscopic image from an ERCP shows contrast opacification and guidewire within the common bile and common hepatic ducts. At least 2 rounded filling defects are seen within the distal common bile duct, consistent with choledocholithiasis. Mild dilatation of the distal common bile duct is demonstrated. IMPRESSION: Mild dilatation of distal common bile duct, with at least 2 distal common duct calculi. These images were submitted for radiologic interpretation only. Please see the procedural report for the amount of contrast and the  fluoroscopy time utilized. Electronically Signed   By: Myles Rosenthal M.D.   On: 11/05/2014 10:54   Ct Cta Abd/pel W/cm &/or W/o Cm  11/04/2014  CLINICAL DATA:  69 year old male reportedly at baseline mental status  yesterday evening found unresponsive and diaphoretic at 4 a.m. this morning. Febrile with axillary temperature of 102.8 degrees. Altered mental status. EXAM: CT CHEST WITHOUT CONTRAST, AND CTA ABDOMEN AND PELVIS WITH CONTRAST TECHNIQUE: Multidetector CT imaging of the chest was performed following the standard protocol without IV contrast. Multidetector CT imaging of the abdomen and pelvis was performed using the standard protocol during bolus administration of intravenous contrast. Multiplanar reconstructed images and MIPs were obtained and reviewed to evaluate the vascular anatomy. CONTRAST:  OMNIPAQUE IOHEXOL 350 MG/ML SOLN COMPARISON:  No priors. FINDINGS: CT CHEST FINDINGS Mediastinum/Lymph Nodes: Heart size is normal. There is no significant pericardial fluid, thickening or pericardial calcification. There is atherosclerosis of the thoracic aorta, the great vessels of the mediastinum and the coronary arteries, including calcified atherosclerotic plaque in the left main, left anterior descending, left circumflex and right coronary arteries. Calcifications of the aortic valve. No pathologically enlarged mediastinal or hilar lymph nodes. Please note that accurate exclusion of hilar adenopathy is limited on noncontrast CT scans. Esophagus is unremarkable in appearance. No axillary lymphadenopathy. Lungs/Pleura: Dependent atelectasis in the lower lobes of the lungs bilaterally. No acute consolidative airspace disease. No pleural effusions. No suspicious appearing pulmonary nodules or masses. Musculoskeletal/Soft Tissues: There are no aggressive appearing lytic or blastic lesions noted in the visualized portions of the skeleton. CTA ABDOMEN AND PELVIS FINDINGS Hepatobiliary: Mild diffuse decreased  attenuation throughout the hepatic parenchyma. Multiple small calcified granulomas in the liver incidentally noted. Somewhat ill-defined ovoid shaped 1.5 x 1.0 cm hypovascular lesion between segments 5 and 6 in the liver (image 24 of series 6) is indeterminate. No other suspicious hepatic lesions are noted. Very mild periportal edema. No definite intra hepatic biliary ductal dilatation. There are some tiny high attenuation foci lying dependently in the neck of the gallbladder. In addition, in the mid common bile ducts (image 32 of series 6 and coronal image 61 of series 12) there is a 5 x 8 x 10 mm high attenuation focus, presumably a ductal stone. Proximal to this stone the common bile duct is normal in caliber measuring 7 mm in the porta hepatis. Distally, the duct dilates up to 9 mm just before the ampulla. Pancreas: No pancreatic ductal dilatation. No pancreatic mass. No pancreatic or peripancreatic fluid or inflammatory changes. Spleen: Unremarkable. Adrenals/Urinary Tract: Sub cm low-attenuation lesion in the lower pole of the left kidney is too small to characterize, but favored to represent a cyst. Right kidney is normal in appearance. Bilateral perinephric stranding (nonspecific). No hydroureteronephrosis. Urinary bladder is normal in appearance. Bilateral adrenal glands are normal in appearance. Stomach/Bowel: Normal appearance of the stomach. No pathologic dilatation of small bowel or colon. Numerous colonic diverticulae are noted, particularly in the sigmoid colon, without surrounding inflammatory changes to suggest an acute diverticulitis at this time. Normal appendix. Vascular/Lymphatic: Atherosclerosis throughout the abdominal and pelvic vasculature, without evidence of aneurysm or dissection. Celiac axis, superior mesenteric artery and inferior mesenteric artery and their major branches are all widely patent without definite hemodynamic significant stenosis. Single renal arteries bilaterally. No  lymphadenopathy noted in the abdomen or pelvis. Reproductive: Prostate gland seminal vesicles are unremarkable in appearance. Other: No significant volume of ascites.  No pneumoperitoneum. Musculoskeletal: There are no aggressive appearing lytic or blastic lesions noted in the visualized portions of the skeleton. Review of the MIP images confirms the above findings. IMPRESSION: 1. Cholelithiasis and choledocholithiasis. No overt signs of biliary tract obstruction at this time, although it is unusual that the very  distal common bile duct immediately before the ampulla is mildly dilated. There is some mild periportal edema in the right lobe of the liver which is nonspecific, but correlation with liver function tests is recommended. 2. Ill-defined 1.5 x 1.0 cm hypovascular lesion between segments 5 and 6 in the right lobe of the liver. This is nonspecific, and would require further evaluation with nonemergent MRI with and without IV gadolinium at some point in near future for more definitive characterization. 3. No acute findings in the thorax. 4. Atherosclerosis, including left main and 3 vessel coronary artery disease. Please note that although the presence of coronary artery calcium documents the presence of coronary artery disease, the severity of this disease and any potential stenosis cannot be assessed on this non-gated CT examination. Assessment for potential risk factor modification, dietary therapy or pharmacologic therapy may be warranted, if clinically indicated. 5. Colonic diverticulosis without evidence of acute diverticulitis at this time. 6. Additional incidental findings, as above. Electronically Signed   By: Trudie Reed M.D.   On: 11/04/2014 08:35    CBC  Recent Labs Lab 11/04/14 0623 11/04/14 1426 11/05/14 0620 11/06/14 0016  WBC 12.9* 17.5* 11.5* 12.0*  HGB 15.0 13.1 12.7* 13.9  HCT 44.4 38.4* 39.1 40.9  PLT 152 140* 117* 115*  MCV 93.4 93.7 96.1 95.6  MCH 31.6 32.0 31.2 32.5   MCHC 33.9 34.1 32.5 34.0  RDW 13.5 13.2 13.5 13.4  LYMPHSABS 0.4* 0.6* 1.0 2.0  MONOABS 0.1* 0.9 0.6 1.0  EOSABS 0.0 0.0 0.0 0.1  BASOSABS 0.0 0.0 0.0 0.0    Chemistries   Recent Labs Lab 11/04/14 0623 11/04/14 1426 11/05/14 0620 11/06/14 0016  NA 139 132* 135 138  K 2.8* 3.3* 3.2* 4.2  CL 100* 97* 100* 105  CO2 23 25 27 22   GLUCOSE 147* 161* 113* 96  BUN 13 7 6 6   CREATININE 1.08 0.83 0.87 0.82  CALCIUM 9.0 7.6* 7.6* 8.4*  MG  --  1.6*  --   --   AST 85* 59* 41 40  ALT 188* 140* 107* 90*  ALKPHOS 180* 131* 113 107  BILITOT 1.3* 1.3* 1.1 1.3*   ------------------------------------------------------------------------------------------------------------------ estimated creatinine clearance is 92 mL/min (by C-G formula based on Cr of 0.82). ------------------------------------------------------------------------------------------------------------------ No results for input(s): HGBA1C in the last 72 hours. ------------------------------------------------------------------------------------------------------------------ No results for input(s): CHOL, HDL, LDLCALC, TRIG, CHOLHDL, LDLDIRECT in the last 72 hours. ------------------------------------------------------------------------------------------------------------------ No results for input(s): TSH, T4TOTAL, T3FREE, THYROIDAB in the last 72 hours.  Invalid input(s): FREET3 ------------------------------------------------------------------------------------------------------------------ No results for input(s): VITAMINB12, FOLATE, FERRITIN, TIBC, IRON, RETICCTPCT in the last 72 hours.  Coagulation profile  Recent Labs Lab 11/04/14 1426  INR 1.08    No results for input(s): DDIMER in the last 72 hours.  Cardiac Enzymes  Recent Labs Lab 11/04/14 0623  TROPONINI <0.03   ------------------------------------------------------------------------------------------------------------------ Invalid input(s):  POCBNP  No results for input(s): GLUCAP in the last 72 hours.   Beauty Pless M.D. [facility] 11/07/2014, 2:43 PM  Pager: [phone] Between 7am to 7pm - call Pager - [phone]  After 7pm go to [email or web address] - password [email or web address]  Call night coverage person covering after 7pm

## 2014-11-07 NOTE — Anesthesia Postprocedure Evaluation (Signed)
  Anesthesia Post-op Note  Patient: Jesus White  Procedure(s) Performed: Procedure(s): LAPAROSCOPIC CHOLECYSTECTOMY (N/A)  Patient Location: PACU  Anesthesia Type:General  Level of Consciousness: awake, alert  and oriented  Airway and Oxygen Therapy: Patient Spontanous Breathing  Post-op Pain: mild  Post-op Assessment: Post-op Vital signs reviewed, Patient's Cardiovascular Status Stable, Respiratory Function Stable, Patent Airway and Pain level controlled              Post-op Vital Signs: stable  Last Vitals:  Filed Vitals:   11/07/14 1430  BP:   Pulse: 100  Temp:   Resp: 22    Complications: No apparent anesthesia complications

## 2014-11-08 ENCOUNTER — Encounter (HOSPITAL_COMMUNITY): Payer: Self-pay | Admitting: Surgery

## 2014-11-08 MED ORDER — LOVASTATIN 40 MG PO TABS: 80.0000 mg | ORAL_TABLET | Freq: Every day | ORAL | Status: AC

## 2014-11-08 MED ORDER — PROMETHAZINE HCL 12.5 MG PO TABS: 12.5000 mg | ORAL_TABLET | Freq: Four times a day (QID) | ORAL | Status: DC | PRN

## 2014-11-08 MED ORDER — HYDROCODONE-ACETAMINOPHEN 5-325 MG PO TABS: 1.0000 | ORAL_TABLET | Freq: Four times a day (QID) | ORAL | Status: DC | PRN

## 2014-11-08 NOTE — Progress Notes (Signed)
NURSING PROGRESS NOTE  Jesus LisDarryl H White [id number] Discharge Data: 11/08/2014 2:53 PM Attending Provider: No att. providers found [facility]:[id number]:LINTHAVONG, Trisha MangleKANHKA, MD     Jesus Lisarryl H White to be D/C'd Home per MD order.  Discussed with the patient the After Visit Summary and all questions fully answered. All IV's discontinued with no bleeding noted. All belongings returned to patient for patient to take home.   Last Vital Signs:  Blood pressure 139/68, pulse 75, temperature 99 F (37.2 C), temperature source Oral, resp. rate 16, height 6' (1.829 m), weight 87 kg (191 lb 12.8 oz), SpO2 95 %.  Discharge Medication List   Medication List    TAKE these medications        acetaminophen 500 MG tablet  Commonly known as:  TYLENOL  Take 1,000 mg by mouth every 8 (eight) hours as needed (pain).     amLODipine 10 MG tablet  Commonly known as:  NORVASC  Take 10 mg by mouth at bedtime.     hydrochlorothiazide 25 MG tablet  Commonly known as:  HYDRODIURIL  Take 25 mg by mouth at bedtime.     HYDROcodone-acetaminophen 5-325 MG tablet  Commonly known as:  NORCO/VICODIN  Take 1 tablet by mouth every 6 (six) hours as needed for moderate pain.     KRILL OIL PO  Take 1 capsule by mouth daily.     losartan 50 MG tablet  Commonly known as:  COZAAR  Take 75 mg by mouth at bedtime.     lovastatin 40 MG tablet  Commonly known as:  MEVACOR  Take 2 tablets (80 mg total) by mouth at bedtime. HOLD until LFT's are normalized     multivitamin with minerals Tabs tablet  Take 1 tablet by mouth daily.     omeprazole 20 MG capsule  Commonly known as:  PRILOSEC  Take 20 mg by mouth daily.     PARoxetine 20 MG tablet  Commonly known as:  PAXIL  Take 20 mg by mouth at bedtime.     promethazine 12.5 MG tablet  Commonly known as:  PHENERGAN  Take 1 tablet (12.5 mg total) by mouth every 6 (six) hours as needed for nausea or vomiting.         Roma KayserStephanie Rayli Wiederhold, RN

## 2014-11-08 NOTE — Discharge Instructions (Signed)
[facility] ______[facility] °LAPAROSCOPIC SURGERY: POST OP INSTRUCTIONS °Always review your discharge instruction sheet given to you by the facility where your surgery was performed. °IF YOU HAVE DISABILITY OR FAMILY LEAVE FORMS, YOU MUST BRING THEM TO THE OFFICE FOR PROCESSING.   °DO NOT GIVE THEM TO YOUR DOCTOR. ° °1. A prescription for pain medication may be given to you upon discharge.  Take your pain medication as prescribed, if needed.  If narcotic pain medicine is not needed, then you may take acetaminophen (Tylenol) or ibuprofen (Advil) as needed. °2. Take your usually prescribed medications unless otherwise directed. °3. If you need a refill on your pain medication, please contact your pharmacy.  They will contact our office to request authorization. Prescriptions will not be filled after 5pm or on week-ends. °4. You should follow a light diet the first few days after arrival home, such as soup and crackers, etc.  Be sure to include lots of fluids daily. °5. Most patients will experience some swelling and bruising in the area of the incisions.  Ice packs will help.  Swelling and bruising can take several days to resolve.  °6. It is common to experience some constipation if taking pain medication after surgery.  Increasing fluid intake and taking a stool softener (such as Colace) will usually help or prevent this problem from occurring.  A mild laxative (Milk of Magnesia or Miralax) should be taken according to package instructions if there are no bowel movements after 48 hours. °7. Unless discharge instructions indicate otherwise, you may remove your bandages 24-48 hours after surgery, and you may shower at that time.  You may have steri-strips (small skin tapes) in place directly over the incision.  These strips should be left on the skin for 7-10 days.  If your surgeon used skin glue on the incision, you may shower in 24 hours.  The glue will flake off over the next 2-3 weeks.  Any sutures or  staples will be removed at the office during your follow-up visit. °8. ACTIVITIES:  You may resume regular (light) daily activities beginning the next day--such as daily self-care, walking, climbing stairs--gradually increasing activities as tolerated.  You may have sexual intercourse when it is comfortable.  Refrain from any heavy lifting or straining until approved by your doctor. °a. You may drive when you are no longer taking prescription pain medication, you can comfortably wear a seatbelt, and you can safely maneuver your car and apply brakes. °b. RETURN TO WORK:  __________________________________________________________ °9. You should see your doctor in the office for a follow-up appointment approximately 2-3 weeks after your surgery.  Make sure that you call for this appointment within a day or two after you arrive home to insure a convenient appointment time. °10. OTHER INSTRUCTIONS: __________________________________________________________________________________________________________________________ __________________________________________________________________________________________________________________________ °WHEN TO CALL YOUR DOCTOR: °1. Fever over 101.0 °2. Inability to urinate °3. Continued bleeding from incision. °4. Increased pain, redness, or drainage from the incision. °5. Increasing abdominal pain ° °The clinic staff is available to answer your questions during regular business hours.  Please don’t hesitate to call and ask to speak to one of the nurses for clinical concerns.  If you have a medical emergency, go to the nearest emergency room or call 911.  A surgeon from [facility] is always on call at the hospital. °[address], [address], [place], [place]  [address] ? [address] [address], [place], [place]   [address] °[phone] ? [phone] ? FAX [phone] °Web site:   [email or web address] °

## 2014-11-08 NOTE — Discharge Summary (Signed)
Physician Discharge Summary   Patient ID: Jesus White MRN: [id number] DOB/AGE: July 08, 1945 69 y.o.  Admit date: 11/04/2014 Discharge date: 11/08/2014  Primary Care Physician:  Marisue Ivan, MD  Discharge Diagnoses:    . Cholelithiasis with choledocholithiasis status post laparoscopic cholecystectomy  . SIRS (systemic inflammatory response syndrome) (HCC) . Acute encephalopathy . Lactic acidosis . Elevated transaminase level . Hypokalemia . Liver lesion . Coronary atherosclerosis . Hypertension . Hyperlipemia   Consults: Gastroenterology, Dr. Celene Skeen. surgery, Dr. Magnus Ivan   Recommendations for Outpatient Follow-up:  Per general surgery, patient is doing well postop, does not need any further antibiotic therapy. He has a follow-up appointment with general surgery outpatient.  Please check LFTs at the follow-up appointment, patient recommended to hold the statins until then.  TESTS THAT NEED FOLLOW-UP LFTs   DIET: Low-fat diet    Allergies:  No Known Allergies   Discharge Medications:   Medication List    TAKE these medications        acetaminophen 500 MG tablet  Commonly known as:  TYLENOL  Take 1,000 mg by mouth every 8 (eight) hours as needed (pain).     amLODipine 10 MG tablet  Commonly known as:  NORVASC  Take 10 mg by mouth at bedtime.     hydrochlorothiazide 25 MG tablet  Commonly known as:  HYDRODIURIL  Take 25 mg by mouth at bedtime.     HYDROcodone-acetaminophen 5-325 MG tablet  Commonly known as:  NORCO/VICODIN  Take 1 tablet by mouth every 6 (six) hours as needed for moderate pain.     KRILL OIL PO  Take 1 capsule by mouth daily.     losartan 50 MG tablet  Commonly known as:  COZAAR  Take 75 mg by mouth at bedtime.     lovastatin 40 MG tablet  Commonly known as:  MEVACOR  Take 2 tablets (80 mg total) by mouth at bedtime. HOLD until LFT's are normalized     multivitamin with minerals Tabs tablet  Take 1 tablet by mouth  daily.     omeprazole 20 MG capsule  Commonly known as:  PRILOSEC  Take 20 mg by mouth daily.     PARoxetine 20 MG tablet  Commonly known as:  PAXIL  Take 20 mg by mouth at bedtime.     promethazine 12.5 MG tablet  Commonly known as:  PHENERGAN  Take 1 tablet (12.5 mg total) by mouth every 6 (six) hours as needed for nausea or vomiting.         Brief H and P: For complete details please refer to admission H and P, but in brief Per Dr. Katrinka Blazing on 11/04/14 Jesus White is a 69 y.o. male with a past medical history that includes hypertension, hyperlipidemia, anxiety presents to room 34 on 5 W. at [facility] from the emergency department at [facility] with fever, tachycardia and acute encephalopathy. Initial workup at [place] includes CT of the abdomen pelvis concerning for choledocholithiasis with cholelithiasis. He was transferred to [place] is GI services were not immediately available. History was obtained from the patient who reported that he was having chills, feeling "feverish" yesterday worsening in the evening to the point he could not "stop shaking" he reports his next memory is waking up in the [facility] emergency department. Chart indicates patient was brought to the hospital as his wife found him unresponsive early this morning. EMS reports when they arrived he had an axillary temp of 102.8. It is  noted that he was given 500 mils of normal saline in and route he became more alert but remained confused. He was provided with cooling packs. Wife reported patient did complain of some abdominal pain the prior day. Wife indicated at 4am patient restless in bed and she believes he had emesis. No report of diarrhea. She did indicate he was not eating or drinking as he normally does. Upon arrival to [facility] ED, patient had fever, tachycardia and was confused. code sepsis was called patient was given 3 L of normal saline he was also provided with vancomycin and  Zosyn for an unknown infection. Additionally he received Tylenol rectally. Chest x-ray was unremarkable, urinalysis unremarkable, CT of the abdomen concerning for choledoholithiasis with cholelithiasis. Lactic acid 6.2 and within normal limits upon arrival to [facility]. Chart indicates gastroenterology not available so patient was transferred.   Hospital Course:    SIRS (systemic inflammatory response syndrome) (HCC), cholangitis, transaminitis:  - CT abdomen showed cholelithiasis and choledocholithiasis, ill-defined 1.5 x 1 cm hypovascular lesion in the right lobe of the liver, recommended nonemergent MRI  - Gastroenterology was consulted, ERCP showed single ovoid common bile duct stones removed after sphincterotomy, surgery consulted -Patient was placed on broad-spectrum IV antibiotics, IV fluid hydration, pain control. General surgery was consulted. Patient underwent laparoscopic cholecystectomy on 10/18.  - Blood cultures, urine culture has remained negative. Per general surgery, patient is doing well postoperatively and does not need antibiotics from surgical standpoint. He has a follow-up appointment outpatient with general surgery.   Acute encephalopathy: Related to above, currently at baseline. - Much improved after fluid resuscitation. CT of the head with no acute abnormalities.   Lactic acidosis: Secondary to #1 - Lactic acid level within the limits of normal 3 hours after fluid resuscitation  Hypertension: - Currently stable monitor closely   Hyperlipidemia:  -Patient has been recommended to hold statins secondary to transaminitis. Please check LFTs in 2-3 weeks or on the follow-up appointment.   Liver lesion: - CT of the abdomen showed ill-defined 1.5 x 1 cm hypo-vascular lesion in the right lobe of the liver, nonspecific, recommended non-emergent MRI.   Coronary atherosclerosis: Per CT. Patient denies history of same. No chest pain. Outpatient follow-up recommended.      Day of Discharge BP 139/68 mmHg  Pulse 75  Temp(Src) 99 F (37.2 C) (Oral)  Resp 16  Ht 6' (1.829 m)  Wt 87 kg (191 lb 12.8 oz)  BMI 26.01 kg/m2  SpO2 95%  Physical Exam: General: Alert and awake oriented x3 not in any acute distress. HEENT: anicteric sclera, pupils reactive to light and accommodation CVS: S1-S2 clear no murmur rubs or gallops Chest: clear to auscultation bilaterally, no wheezing rales or rhonchi Abdomen: soft , appropriately tender, nondistended, normal bowel sounds, Incisions c/d/i Extremities: no cyanosis, clubbing or edema noted bilaterally Neuro: Cranial nerves II-XII intact, no focal neurological deficits   The results of significant diagnostics from this hospitalization (including imaging, microbiology, ancillary and laboratory) are listed below for reference.    LAB RESULTS: Basic Metabolic Panel:  Recent Labs Lab 11/04/14 1426 11/05/14 0620 11/06/14 0016  NA 132* 135 138  K 3.3* 3.2* 4.2  CL 97* 100* 105  CO2 25 27 22   GLUCOSE 161* 113* 96  BUN 7 6 6   CREATININE 0.83 0.87 0.82  CALCIUM 7.6* 7.6* 8.4*  MG 1.6*  --   --    Liver Function Tests:  Recent Labs Lab 11/05/14 0620 11/06/14 0016  AST 41 40  ALT 107* 90*  ALKPHOS 113 107  BILITOT 1.1 1.3*  PROT 5.5* 6.1*  ALBUMIN 2.7* 3.2*   No results for input(s): LIPASE, AMYLASE in the last 168 hours. No results for input(s): AMMONIA in the last 168 hours. CBC:  Recent Labs Lab 11/05/14 0620 11/06/14 0016  WBC 11.5* 12.0*  NEUTROABS 9.9* 9.0*  HGB 12.7* 13.9  HCT 39.1 40.9  MCV 96.1 95.6  PLT 117* 115*   Cardiac Enzymes:  Recent Labs Lab 11/04/14 0623  TROPONINI <0.03   BNP: Invalid input(s): POCBNP CBG: No results for input(s): GLUCAP in the last 168 hours.  Significant Diagnostic Studies:  Ct Head Wo Contrast  11/04/2014  CLINICAL DATA:  69 year old male found at 4 a.m. to be diaphoretic and unresponsive. Previously normal mental status. Febrile with  temperature of 102.8 degrees. EXAM: CT HEAD WITHOUT CONTRAST TECHNIQUE: Contiguous axial images were obtained from the base of the skull through the vertex without intravenous contrast. COMPARISON:  No priors. FINDINGS: Patchy areas of mild decreased attenuation are noted throughout the deep and periventricular white matter of the cerebral hemispheres bilaterally, compatible with mild chronic microvascular ischemic disease. No acute intracranial abnormalities. Specifically, no evidence of acute intracranial hemorrhage, no definite findings of acute/subacute cerebral ischemia, no mass, mass effect, hydrocephalus or abnormal intra or extra-axial fluid collections. Visualized paranasal sinuses and mastoids are well pneumatized. No acute displaced skull fractures are identified. IMPRESSION: 1. No acute intracranial abnormalities. 2. Mild chronic microvascular ischemic changes in cerebral white matter, as above. Electronically Signed   By: Trudie Reedaniel  Entrikin M.D.   On: 11/04/2014 07:58   Ct Chest Wo Contrast  11/04/2014  CLINICAL DATA:  69 year old male reportedly at baseline mental status yesterday evening found unresponsive and diaphoretic at 4 a.m. this morning. Febrile with axillary temperature of 102.8 degrees. Altered mental status. EXAM: CT CHEST WITHOUT CONTRAST, AND CTA ABDOMEN AND PELVIS WITH CONTRAST TECHNIQUE: Multidetector CT imaging of the chest was performed following the standard protocol without IV contrast. Multidetector CT imaging of the abdomen and pelvis was performed using the standard protocol during bolus administration of intravenous contrast. Multiplanar reconstructed images and MIPs were obtained and reviewed to evaluate the vascular anatomy. CONTRAST:  100mL OMNIPAQUE IOHEXOL 350 MG/ML SOLN COMPARISON:  No priors. FINDINGS: CT CHEST FINDINGS Mediastinum/Lymph Nodes: Heart size is normal. There is no significant pericardial fluid, thickening or pericardial calcification. There is  atherosclerosis of the thoracic aorta, the great vessels of the mediastinum and the coronary arteries, including calcified atherosclerotic plaque in the left main, left anterior descending, left circumflex and right coronary arteries. Calcifications of the aortic valve. No pathologically enlarged mediastinal or hilar lymph nodes. Please note that accurate exclusion of hilar adenopathy is limited on noncontrast CT scans. Esophagus is unremarkable in appearance. No axillary lymphadenopathy. Lungs/Pleura: Dependent atelectasis in the lower lobes of the lungs bilaterally. No acute consolidative airspace disease. No pleural effusions. No suspicious appearing pulmonary nodules or masses. Musculoskeletal/Soft Tissues: There are no aggressive appearing lytic or blastic lesions noted in the visualized portions of the skeleton. CTA ABDOMEN AND PELVIS FINDINGS Hepatobiliary: Mild diffuse decreased attenuation throughout the hepatic parenchyma. Multiple small calcified granulomas in the liver incidentally noted. Somewhat ill-defined ovoid shaped 1.5 x 1.0 cm hypovascular lesion between segments 5 and 6 in the liver (image 24 of series 6) is indeterminate. No other suspicious hepatic lesions are noted. Very mild periportal edema. No definite intra hepatic biliary ductal dilatation. There are some tiny high attenuation foci lying dependently in the  neck of the gallbladder. In addition, in the mid common bile ducts (image 32 of series 6 and coronal image 61 of series 12) there is a 5 x 8 x 10 mm high attenuation focus, presumably a ductal stone. Proximal to this stone the common bile duct is normal in caliber measuring 7 mm in the porta hepatis. Distally, the duct dilates up to 9 mm just before the ampulla. Pancreas: No pancreatic ductal dilatation. No pancreatic mass. No pancreatic or peripancreatic fluid or inflammatory changes. Spleen: Unremarkable. Adrenals/Urinary Tract: Sub cm low-attenuation lesion in the lower pole of the  left kidney is too small to characterize, but favored to represent a cyst. Right kidney is normal in appearance. Bilateral perinephric stranding (nonspecific). No hydroureteronephrosis. Urinary bladder is normal in appearance. Bilateral adrenal glands are normal in appearance. Stomach/Bowel: Normal appearance of the stomach. No pathologic dilatation of small bowel or colon. Numerous colonic diverticulae are noted, particularly in the sigmoid colon, without surrounding inflammatory changes to suggest an acute diverticulitis at this time. Normal appendix. Vascular/Lymphatic: Atherosclerosis throughout the abdominal and pelvic vasculature, without evidence of aneurysm or dissection. Celiac axis, superior mesenteric artery and inferior mesenteric artery and their major branches are all widely patent without definite hemodynamic significant stenosis. Single renal arteries bilaterally. No lymphadenopathy noted in the abdomen or pelvis. Reproductive: Prostate gland seminal vesicles are unremarkable in appearance. Other: No significant volume of ascites.  No pneumoperitoneum. Musculoskeletal: There are no aggressive appearing lytic or blastic lesions noted in the visualized portions of the skeleton. Review of the MIP images confirms the above findings. IMPRESSION: 1. Cholelithiasis and choledocholithiasis. No overt signs of biliary tract obstruction at this time, although it is unusual that the very distal common bile duct immediately before the ampulla is mildly dilated. There is some mild periportal edema in the right lobe of the liver which is nonspecific, but correlation with liver function tests is recommended. 2. Ill-defined 1.5 x 1.0 cm hypovascular lesion between segments 5 and 6 in the right lobe of the liver. This is nonspecific, and would require further evaluation with nonemergent MRI with and without IV gadolinium at some point in near future for more definitive characterization. 3. No acute findings in the  thorax. 4. Atherosclerosis, including left main and 3 vessel coronary artery disease. Please note that although the presence of coronary artery calcium documents the presence of coronary artery disease, the severity of this disease and any potential stenosis cannot be assessed on this non-gated CT examination. Assessment for potential risk factor modification, dietary therapy or pharmacologic therapy may be warranted, if clinically indicated. 5. Colonic diverticulosis without evidence of acute diverticulitis at this time. 6. Additional incidental findings, as above. Electronically Signed   By: Trudie Reed M.D.   On: 11/04/2014 08:35   Dg Chest Port 1 View  11/04/2014  CLINICAL DATA:  Patient was found unresponsive at 6 a.m. today. Fever. History of hypertension. EXAM: PORTABLE CHEST 1 VIEW COMPARISON:  None. FINDINGS: Shallow inspiration with atelectasis in the lung bases. Heart size and pulmonary vascularity are normal. No focal consolidation in the lungs. No blunting of costophrenic angles. No pneumothorax. Mediastinal contours appear intact. IMPRESSION: Shallow inspiration with atelectasis in the lung bases. Electronically Signed   By: Burman Nieves M.D.   On: 11/04/2014 06:51   Dg Ercp Biliary & Pancreatic Ducts  11/05/2014  CLINICAL DATA:  Cholelithiasis and choledocholithiasis. Elevated liver function tests. Fever. EXAM: ERCP TECHNIQUE: Multiple spot images obtained with the fluoroscopic device and submitted for  interpretation post-procedure. FLUOROSCOPY TIME:  Fluoroscopy Time:  1 minutes 52 seconds Number of Acquired Images:  1 COMPARISON:  None. FINDINGS: Single fluoroscopic image from an ERCP shows contrast opacification and guidewire within the common bile and common hepatic ducts. At least 2 rounded filling defects are seen within the distal common bile duct, consistent with choledocholithiasis. Mild dilatation of the distal common bile duct is demonstrated. IMPRESSION: Mild dilatation of  distal common bile duct, with at least 2 distal common duct calculi. These images were submitted for radiologic interpretation only. Please see the procedural report for the amount of contrast and the fluoroscopy time utilized. Electronically Signed   By: Myles Rosenthal M.D.   On: 11/05/2014 10:54   Ct Cta Abd/pel W/cm &/or W/o Cm  11/04/2014  CLINICAL DATA:  69 year old male reportedly at baseline mental status yesterday evening found unresponsive and diaphoretic at 4 a.m. this morning. Febrile with axillary temperature of 102.8 degrees. Altered mental status. EXAM: CT CHEST WITHOUT CONTRAST, AND CTA ABDOMEN AND PELVIS WITH CONTRAST TECHNIQUE: Multidetector CT imaging of the chest was performed following the standard protocol without IV contrast. Multidetector CT imaging of the abdomen and pelvis was performed using the standard protocol during bolus administration of intravenous contrast. Multiplanar reconstructed images and MIPs were obtained and reviewed to evaluate the vascular anatomy. CONTRAST:  OMNIPAQUE IOHEXOL 350 MG/ML SOLN COMPARISON:  No priors. FINDINGS: CT CHEST FINDINGS Mediastinum/Lymph Nodes: Heart size is normal. There is no significant pericardial fluid, thickening or pericardial calcification. There is atherosclerosis of the thoracic aorta, the great vessels of the mediastinum and the coronary arteries, including calcified atherosclerotic plaque in the left main, left anterior descending, left circumflex and right coronary arteries. Calcifications of the aortic valve. No pathologically enlarged mediastinal or hilar lymph nodes. Please note that accurate exclusion of hilar adenopathy is limited on noncontrast CT scans. Esophagus is unremarkable in appearance. No axillary lymphadenopathy. Lungs/Pleura: Dependent atelectasis in the lower lobes of the lungs bilaterally. No acute consolidative airspace disease. No pleural effusions. No suspicious appearing pulmonary nodules or masses.  Musculoskeletal/Soft Tissues: There are no aggressive appearing lytic or blastic lesions noted in the visualized portions of the skeleton. CTA ABDOMEN AND PELVIS FINDINGS Hepatobiliary: Mild diffuse decreased attenuation throughout the hepatic parenchyma. Multiple small calcified granulomas in the liver incidentally noted. Somewhat ill-defined ovoid shaped 1.5 x 1.0 cm hypovascular lesion between segments 5 and 6 in the liver (image 24 of series 6) is indeterminate. No other suspicious hepatic lesions are noted. Very mild periportal edema. No definite intra hepatic biliary ductal dilatation. There are some tiny high attenuation foci lying dependently in the neck of the gallbladder. In addition, in the mid common bile ducts (image 32 of series 6 and coronal image 61 of series 12) there is a 5 x 8 x 10 mm high attenuation focus, presumably a ductal stone. Proximal to this stone the common bile duct is normal in caliber measuring 7 mm in the porta hepatis. Distally, the duct dilates up to 9 mm just before the ampulla. Pancreas: No pancreatic ductal dilatation. No pancreatic mass. No pancreatic or peripancreatic fluid or inflammatory changes. Spleen: Unremarkable. Adrenals/Urinary Tract: Sub cm low-attenuation lesion in the lower pole of the left kidney is too small to characterize, but favored to represent a cyst. Right kidney is normal in appearance. Bilateral perinephric stranding (nonspecific). No hydroureteronephrosis. Urinary bladder is normal in appearance. Bilateral adrenal glands are normal in appearance. Stomach/Bowel: Normal appearance of the stomach. No pathologic dilatation of  small bowel or colon. Numerous colonic diverticulae are noted, particularly in the sigmoid colon, without surrounding inflammatory changes to suggest an acute diverticulitis at this time. Normal appendix. Vascular/Lymphatic: Atherosclerosis throughout the abdominal and pelvic vasculature, without evidence of aneurysm or dissection.  Celiac axis, superior mesenteric artery and inferior mesenteric artery and their major branches are all widely patent without definite hemodynamic significant stenosis. Single renal arteries bilaterally. No lymphadenopathy noted in the abdomen or pelvis. Reproductive: Prostate gland seminal vesicles are unremarkable in appearance. Other: No significant volume of ascites.  No pneumoperitoneum. Musculoskeletal: There are no aggressive appearing lytic or blastic lesions noted in the visualized portions of the skeleton. Review of the MIP images confirms the above findings. IMPRESSION: 1. Cholelithiasis and choledocholithiasis. No overt signs of biliary tract obstruction at this time, although it is unusual that the very distal common bile duct immediately before the ampulla is mildly dilated. There is some mild periportal edema in the right lobe of the liver which is nonspecific, but correlation with liver function tests is recommended. 2. Ill-defined 1.5 x 1.0 cm hypovascular lesion between segments 5 and 6 in the right lobe of the liver. This is nonspecific, and would require further evaluation with nonemergent MRI with and without IV gadolinium at some point in near future for more definitive characterization. 3. No acute findings in the thorax. 4. Atherosclerosis, including left main and 3 vessel coronary artery disease. Please note that although the presence of coronary artery calcium documents the presence of coronary artery disease, the severity of this disease and any potential stenosis cannot be assessed on this non-gated CT examination. Assessment for potential risk factor modification, dietary therapy or pharmacologic therapy may be warranted, if clinically indicated. 5. Colonic diverticulosis without evidence of acute diverticulitis at this time. 6. Additional incidental findings, as above. Electronically Signed   By: Trudie Reed M.D.   On: 11/04/2014 08:35    2D ECHO:   Disposition and  Follow-up: Discharge Instructions    Diet - low sodium heart healthy    Complete by:  As directed      Discharge instructions    Complete by:  As directed   Please eat low-fat diet  Please hold lovastatin until your liver function tests have normalized. Please have your primary care physician check your liver function test in 2-3 weeks or on your follow-up appointment.     Increase activity slowly    Complete by:  As directed             DISPOSITION:Home  DISCHARGE FOLLOW-UP Follow-up Information    Follow up with LIEPINS, ANDY, PA-C On 11/22/2014.   Specialty:  Surgery   Why:  [facility] Surgery, 11:00am, arrive no later than 10:30am for paperwork   Contact information:   [address] [place] [address] [phone]       Follow up with Marisue Ivan, MD. Go on 11/14/2014.   Specialty:  Family Medicine   Why:  Appointment with Dr. Burnadette Pop is on 11/14/14 at 9am   Contact information:   [address] [facility] [place] [address] [phone]        Time spent on Discharge: 35 minutes  Signed:   Issai Werling M.D. [facility] 11/08/2014, 12:44 PM Pager: [phone]

## 2014-11-08 NOTE — Progress Notes (Signed)
Patient ID: Jesus White, male   DOB: 01/06/1946, 69 y.o.   MRN: [id number] 1 Day Post-Op  Subjective: Pt looks great.  Sitting up in his chair.  Ready for some solid food.  Minimal pain  Objective: Vital signs in last 24 hours: Temp:  [97.2 F (36.2 C)-98.5 F (36.9 C)] 98.5 F (36.9 C) (10/19 0651) Pulse Rate:  [76-107] 77 (10/19 0651) Resp:  [13-22] 13 (10/19 0651) BP: (142-171)/(67-92) 142/71 mmHg (10/19 0651) SpO2:  [93 %-99 %] 94 % (10/19 0651) Last BM Date: 11/07/14  Intake/Output from previous day: 10/18 0701 - 10/19 0700 In: 4360.5 [P.O.:550; I.V.:3260.5; IV Piggyback:550] Out: 5 [Blood:5] Intake/Output this shift:    PE: Abd: soft, appropriately tender, +BS, ND, incisions c/d/i  Lab Results:   Recent Labs  11/06/14 0016  WBC 12.0*  HGB 13.9  HCT 40.9  PLT 115*   BMET  Recent Labs  11/06/14 0016  NA 138  K 4.2  CL 105  CO2 22  GLUCOSE 96  BUN 6  CREATININE 0.82  CALCIUM 8.4*   PT/INR No results for input(s): LABPROT, INR in the last 72 hours. CMP     Component Value Date/Time   NA 138 11/06/2014 0016   NA 134* 12/19/2012 0421   K 4.2 11/06/2014 0016   K 3.4* 05/11/2013 1024   CL 105 11/06/2014 0016   CL 101 12/19/2012 0421   CO2 22 11/06/2014 0016   CO2 28 12/19/2012 0421   GLUCOSE 96 11/06/2014 0016   GLUCOSE 102* 12/19/2012 0421   BUN 6 11/06/2014 0016   BUN 8 12/19/2012 0421   CREATININE 0.82 11/06/2014 0016   CREATININE 0.85 12/19/2012 0421   CALCIUM 8.4* 11/06/2014 0016   CALCIUM 8.4* 12/19/2012 0421   PROT 6.1* 11/06/2014 0016   PROT 7.1 12/17/2012 0951   ALBUMIN 3.2* 11/06/2014 0016   ALBUMIN 4.0 12/17/2012 0951   AST 40 11/06/2014 0016   AST 26 12/17/2012 0951   ALT 90* 11/06/2014 0016   ALT 31 12/17/2012 0951   ALKPHOS 107 11/06/2014 0016   ALKPHOS 76 12/17/2012 0951   BILITOT 1.3* 11/06/2014 0016   BILITOT 0.6 12/17/2012 0951   GFRNONAA >60 11/06/2014 0016   GFRNONAA >60 12/19/2012 0421   GFRAA >60 11/06/2014  0016   GFRAA >60 12/19/2012 0421   Lipase  No results found for: LIPASE     Studies/Results: No results found.  Anti-infectives: Anti-infectives    Start     Dose/Rate Route Frequency Ordered Stop   11/07/14 1245  piperacillin-tazobactam (ZOSYN) IVPB 3.375 g  Status:  Discontinued     3.375 g 12.5 mL/hr over 240 Minutes Intravenous To Surgery 11/07/14 1245 11/07/14 1909   11/05/14 0300  vancomycin (VANCOCIN) IVPB 1000 mg/200 mL premix     1,000 mg 200 mL/hr over 60 Minutes Intravenous Every 12 hours 11/04/14 2209     11/04/14 1400  piperacillin-tazobactam (ZOSYN) IVPB 3.375 g     3.375 g 12.5 mL/hr over 240 Minutes Intravenous 3 times per day 11/04/14 1323     11/04/14 1330  piperacillin-tazobactam (ZOSYN) IVPB 3.375 g  Status:  Discontinued     3.375 g 100 mL/hr over 30 Minutes Intravenous  Once 11/04/14 1304 11/04/14 1323   11/04/14 1330  vancomycin (VANCOCIN) IVPB 1000 mg/200 mL premix     1,000 mg 200 mL/hr over 60 Minutes Intravenous  Once 11/04/14 1304 11/04/14 1459       Assessment/Plan  POD 1, s/p lap chole -s/p  ERCP for choledocholithiasis -doing well  -try low fat diet.  If he tolerates this, he is surgically stable for dc home. -he does NOT need any further abx therapy from our standpoint at dc. -follow up in 3 weeks has been arranged in our office.  LOS: 4 days    Alexandre Lightsey E 11/08/2014, 8:34 AM Pager: [phone]

## 2014-11-08 NOTE — Consult Note (Signed)
Jesus White

## 2014-11-08 NOTE — Care Management Note (Signed)
Case Management Note  Patient Details  Name: Jesus White MRN: [id number] Date of Birth: [birth year]  Subjective/Objective:       Patient is for dc today, no needs.              Action/Plan:   Expected Discharge Date:                  Expected Discharge Plan:  Home w Home Health Services  In-House Referral:     Discharge planning Services  CM Consult  Post Acute Care Choice:    Choice offered to:     DME Arranged:    DME Agency:     HH Arranged:    HH Agency:     Status of Service:  Completed, signed off  Medicare Important Message Given:  Yes-second notification given Date Medicare IM Given:    Medicare IM give by:    Date Additional Medicare IM Given:    Additional Medicare Important Message give by:     If discussed at Long Length of Stay Meetings, dates discussed:    Additional Comments:  Leone Havenaylor, Toniqua Melamed Clinton, RN 11/08/2014, 12:09 PM

## 2014-11-09 LAB — CULTURE, BLOOD (ROUTINE X 2)
CULTURE: NO GROWTH
Culture: NO GROWTH
Culture: NO GROWTH
Culture: NO GROWTH

## 2014-11-15 ENCOUNTER — Encounter (HOSPITAL_COMMUNITY): Payer: Self-pay

## 2014-11-15 ENCOUNTER — Encounter (HOSPITAL_COMMUNITY): Payer: Self-pay | Admitting: Surgery

## 2014-11-17 ENCOUNTER — Encounter (HOSPITAL_COMMUNITY): Payer: Self-pay | Admitting: Gastroenterology

## 2015-03-28 ENCOUNTER — Encounter: Payer: Self-pay | Admitting: *Deleted

## 2015-04-06 ENCOUNTER — Encounter
Admission: RE | Disposition: A | Discharge status: Home / Self Care | Payer: Self-pay | Source: Ambulatory Visit | Attending: Ophthalmology

## 2015-04-06 ENCOUNTER — Ambulatory Visit
Admission: RE | Admit: 2015-04-06 | Discharge: 2015-04-06 | Disposition: A | Discharge status: Home / Self Care | Payer: Medicare Other | Source: Ambulatory Visit | Attending: Ophthalmology | Admitting: Ophthalmology

## 2015-04-06 ENCOUNTER — Ambulatory Visit: Payer: Medicare Other | Admitting: Certified Registered Nurse Anesthetist

## 2015-04-06 DIAGNOSIS — F329 Major depressive disorder, single episode, unspecified: Secondary | ICD-10-CM | POA: Insufficient documentation

## 2015-04-06 DIAGNOSIS — Z9049 Acquired absence of other specified parts of digestive tract: Secondary | ICD-10-CM | POA: Diagnosis not present

## 2015-04-06 DIAGNOSIS — H2512 Age-related nuclear cataract, left eye: Secondary | ICD-10-CM | POA: Insufficient documentation

## 2015-04-06 DIAGNOSIS — K219 Gastro-esophageal reflux disease without esophagitis: Secondary | ICD-10-CM | POA: Diagnosis not present

## 2015-04-06 DIAGNOSIS — E7800 Pure hypercholesterolemia, unspecified: Secondary | ICD-10-CM | POA: Diagnosis not present

## 2015-04-06 DIAGNOSIS — Z79899 Other long term (current) drug therapy: Secondary | ICD-10-CM | POA: Insufficient documentation

## 2015-04-06 DIAGNOSIS — Z9889 Other specified postprocedural states: Secondary | ICD-10-CM | POA: Insufficient documentation

## 2015-04-06 DIAGNOSIS — Z9841 Cataract extraction status, right eye: Secondary | ICD-10-CM | POA: Diagnosis not present

## 2015-04-06 DIAGNOSIS — H269 Unspecified cataract: Secondary | ICD-10-CM | POA: Diagnosis present

## 2015-04-06 DIAGNOSIS — M1990 Unspecified osteoarthritis, unspecified site: Secondary | ICD-10-CM | POA: Diagnosis not present

## 2015-04-06 DIAGNOSIS — I10 Essential (primary) hypertension: Secondary | ICD-10-CM | POA: Insufficient documentation

## 2015-04-06 DIAGNOSIS — K5790 Diverticulosis of intestine, part unspecified, without perforation or abscess without bleeding: Secondary | ICD-10-CM | POA: Insufficient documentation

## 2015-04-06 HISTORY — DX: Type 2 diabetes mellitus without complications: E11.9

## 2015-04-06 HISTORY — DX: Unspecified osteoarthritis, unspecified site: M19.90

## 2015-04-06 HISTORY — DX: Gastro-esophageal reflux disease without esophagitis: K21.9

## 2015-04-06 HISTORY — PX: CATARACT EXTRACTION W/PHACO: SHX586

## 2015-04-06 SURGERY — PHACOEMULSIFICATION, CATARACT, WITH IOL INSERTION
Anesthesia: Monitor Anesthesia Care | Laterality: Left | Wound class: Clean

## 2015-04-06 MED ORDER — CARBACHOL 0.01 % IO SOLN
INTRAOCULAR | Status: DC | PRN
  Administered 2015-04-06: .5 mL via INTRAOCULAR

## 2015-04-06 MED ORDER — LIDOCAINE HCL (PF) 4 % IJ SOLN
INTRAOCULAR | Status: DC | PRN
  Administered 2015-04-06: .5 mL via OPHTHALMIC

## 2015-04-06 MED ORDER — BUPIVACAINE HCL (PF) 0.75 % IJ SOLN
INTRAMUSCULAR | Status: DC | PRN
  Administered 2015-04-06: 5 mL via OPHTHALMIC

## 2015-04-06 MED ORDER — CEFUROXIME OPHTHALMIC INJECTION 1 MG/0.1 ML
INJECTION | OPHTHALMIC | Status: DC | PRN
  Administered 2015-04-06: 0.1 mL via INTRACAMERAL

## 2015-04-06 MED ORDER — TETRACAINE HCL 0.5 % OP SOLN
OPHTHALMIC | Status: AC
  Filled 2015-04-06: qty 2

## 2015-04-06 MED ORDER — NA CHONDROIT SULF-NA HYALURON 40-17 MG/ML IO SOLN
INTRAOCULAR | Status: DC | PRN
  Administered 2015-04-06: 1 mL via INTRAOCULAR

## 2015-04-06 MED ORDER — LIDOCAINE HCL (PF) 4 % IJ SOLN
INTRAMUSCULAR | Status: AC
  Filled 2015-04-06: qty 5

## 2015-04-06 MED ORDER — BUPIVACAINE HCL (PF) 0.75 % IJ SOLN
INTRAMUSCULAR | Status: AC
  Filled 2015-04-06: qty 10

## 2015-04-06 MED ORDER — HYALURONIDASE HUMAN 150 UNIT/ML IJ SOLN
INTRAMUSCULAR | Status: AC
  Filled 2015-04-06: qty 1

## 2015-04-06 MED ORDER — CEFUROXIME OPHTHALMIC INJECTION 1 MG/0.1 ML
INJECTION | OPHTHALMIC | Status: AC
Start: 2015-04-06 — End: 2015-04-06
  Filled 2015-04-06: qty 0.1

## 2015-04-06 MED ORDER — NA CHONDROIT SULF-NA HYALURON 40-17 MG/ML IO SOLN
INTRAOCULAR | Status: AC
  Filled 2015-04-06: qty 1

## 2015-04-06 MED ORDER — PHENYLEPHRINE HCL 10 % OP SOLN
1.0000 [drp] | OPHTHALMIC | Status: AC | PRN
  Administered 2015-04-06 (×4): 1 [drp] via OPHTHALMIC

## 2015-04-06 MED ORDER — POVIDONE-IODINE 5 % OP SOLN
OPHTHALMIC | Status: AC
  Filled 2015-04-06: qty 30

## 2015-04-06 MED ORDER — POVIDONE-IODINE 5 % OP SOLN
OPHTHALMIC | Status: DC | PRN
  Administered 2015-04-06: 1 via OPHTHALMIC

## 2015-04-06 MED ORDER — EPINEPHRINE HCL 1 MG/ML IJ SOLN
INTRAMUSCULAR | Status: DC | PRN
  Administered 2015-04-06: 250 mL via OPHTHALMIC

## 2015-04-06 MED ORDER — MOXIFLOXACIN HCL 0.5 % OP SOLN
OPHTHALMIC | Status: AC
  Administered 2015-04-06: 1 [drp] via OPHTHALMIC
  Filled 2015-04-06: qty 3

## 2015-04-06 MED ORDER — TETRACAINE HCL 0.5 % OP SOLN
OPHTHALMIC | Status: DC | PRN
  Administered 2015-04-06: 2 [drp] via OPHTHALMIC

## 2015-04-06 MED ORDER — MIDAZOLAM HCL 2 MG/2ML IJ SOLN
INTRAMUSCULAR | Status: DC | PRN
  Administered 2015-04-06 (×2): 1 mg via INTRAVENOUS

## 2015-04-06 MED ORDER — ALFENTANIL 500 MCG/ML IJ INJ
INJECTION | INTRAMUSCULAR | Status: DC | PRN
  Administered 2015-04-06: 300 ug via INTRAVENOUS

## 2015-04-06 MED ORDER — CYCLOPENTOLATE HCL 2 % OP SOLN
OPHTHALMIC | Status: AC
  Administered 2015-04-06: 1 [drp] via OPHTHALMIC
  Filled 2015-04-06: qty 2

## 2015-04-06 MED ORDER — MOXIFLOXACIN HCL 0.5 % OP SOLN
OPHTHALMIC | Status: DC | PRN
Start: 2015-04-06 — End: 2015-04-06
  Administered 2015-04-06: 2 [drp] via OPHTHALMIC

## 2015-04-06 MED ORDER — CYCLOPENTOLATE HCL 2 % OP SOLN
1.0000 [drp] | OPHTHALMIC | Status: AC | PRN
  Administered 2015-04-06 (×4): 1 [drp] via OPHTHALMIC

## 2015-04-06 MED ORDER — MOXIFLOXACIN HCL 0.5 % OP SOLN
1.0000 [drp] | OPHTHALMIC | Status: AC | PRN
  Administered 2015-04-06 (×3): 1 [drp] via OPHTHALMIC

## 2015-04-06 MED ORDER — SODIUM CHLORIDE 0.9 % IV SOLN
INTRAVENOUS | Status: DC
  Administered 2015-04-06: 07:00:00 via INTRAVENOUS

## 2015-04-06 MED ORDER — EPINEPHRINE HCL 1 MG/ML IJ SOLN
INTRAMUSCULAR | Status: AC
  Filled 2015-04-06: qty 2

## 2015-04-06 MED ORDER — PHENYLEPHRINE HCL 10 % OP SOLN
OPHTHALMIC | Status: AC
  Administered 2015-04-06: 1 [drp] via OPHTHALMIC
  Filled 2015-04-06: qty 5

## 2015-04-06 SURGICAL SUPPLY — 29 items
CANNULA ANT/CHMB 27GA (MISCELLANEOUS) ×3 IMPLANT
CORD BIP STRL DISP 12FT (MISCELLANEOUS) ×3 IMPLANT
CUP MEDICINE 2OZ PLAST GRAD ST (MISCELLANEOUS) ×3 IMPLANT
DRAPE XRAY CASSETTE 23X24 (DRAPES) ×3 IMPLANT
ERASER HMR WETFIELD 18G (MISCELLANEOUS) ×3 IMPLANT
GLOVE BIO SURGEON STRL SZ8 (GLOVE) ×3 IMPLANT
GLOVE SURG LX 6.5 MICRO (GLOVE) ×2
GLOVE SURG LX 8.0 MICRO (GLOVE) ×2
GLOVE SURG LX STRL 6.5 MICRO (GLOVE) ×1 IMPLANT
GLOVE SURG LX STRL 8.0 MICRO (GLOVE) ×1 IMPLANT
GOWN STRL REUS W/ TWL LRG LVL3 (GOWN DISPOSABLE) ×1 IMPLANT
GOWN STRL REUS W/ TWL XL LVL3 (GOWN DISPOSABLE) ×1 IMPLANT
GOWN STRL REUS W/TWL LRG LVL3 (GOWN DISPOSABLE) ×2
GOWN STRL REUS W/TWL XL LVL3 (GOWN DISPOSABLE) ×2
LENS IOL ACRYSOF IQ 20.0 (Intraocular Lens) ×3 IMPLANT
PACK CATARACT (MISCELLANEOUS) ×3 IMPLANT
PACK CATARACT DINGLEDEIN LX (MISCELLANEOUS) ×3 IMPLANT
PACK EYE AFTER SURG (MISCELLANEOUS) ×3 IMPLANT
SHLD EYE VISITEC  UNIV (MISCELLANEOUS) IMPLANT
SOL BSS BAG (MISCELLANEOUS) ×3
SOL PREP PVP 2OZ (MISCELLANEOUS)
SOLUTION BSS BAG (MISCELLANEOUS) ×1 IMPLANT
SOLUTION PREP PVP 2OZ (MISCELLANEOUS) IMPLANT
SUT SILK 5-0 (SUTURE) ×3 IMPLANT
SYR 3ML LL SCALE MARK (SYRINGE) ×3 IMPLANT
SYR 5ML LL (SYRINGE) ×3 IMPLANT
SYR TB 1ML 27GX1/2 LL (SYRINGE) ×3 IMPLANT
WATER STERILE IRR 1000ML POUR (IV SOLUTION) ×3 IMPLANT
WIPE NON LINTING 3.25X3.25 (MISCELLANEOUS) ×3 IMPLANT

## 2015-04-06 NOTE — OR Nursing (Signed)
Chart has patient listed as having Diabetes however patient states he is NOT diabetic.

## 2015-04-06 NOTE — Anesthesia Preprocedure Evaluation (Signed)
Anesthesia Evaluation  Patient identified by MRN, date of birth, ID band Patient awake    Reviewed: Allergy & Precautions, NPO status   Airway Mallampati: II       Dental  (+) Teeth Intact, Caps   Pulmonary neg pulmonary ROS,    breath sounds clear to auscultation       Cardiovascular hypertension, Pt. on medications + CAD   Rhythm:Regular     Neuro/Psych negative neurological ROS     GI/Hepatic Neg liver ROS, GERD  ,  Endo/Other  diabetes  Renal/GU negative Renal ROS     Musculoskeletal   Abdominal Normal abdominal exam  (+)   Peds  Hematology negative hematology ROS (+)   Anesthesia Other Findings   Reproductive/Obstetrics                             Anesthesia Physical Anesthesia Plan  ASA: II  Anesthesia Plan: MAC   Post-op Pain Management:    Induction: Intravenous  Airway Management Planned: Natural Airway and Nasal Cannula  Additional Equipment:   Intra-op Plan:   Post-operative Plan:   Informed Consent: I have reviewed the patients History and Physical, chart, labs and discussed the procedure including the risks, benefits and alternatives for the proposed anesthesia with the patient or authorized representative who has indicated his/her understanding and acceptance.     Plan Discussed with: CRNA  Anesthesia Plan Comments:         Anesthesia Quick Evaluation

## 2015-04-06 NOTE — Discharge Instructions (Addendum)
See handoutAMBULATORY SURGERY  °DISCHARGE INSTRUCTIONS ° ° °1) The drugs that you were given will stay in your system until tomorrow so for the next 24 hours you should not: ° °A) Drive an automobile °B) Make any legal decisions °C) Drink any alcoholic beverage ° ° °2) You may resume regular meals tomorrow.  Today it is better to start with liquids and gradually work up to solid foods. ° °You may eat anything you prefer, but it is better to start with liquids, then soup and crackers, and gradually work up to solid foods. ° ° °3) Please notify your doctor immediately if you have any unusual bleeding, trouble breathing, redness and pain at the surgery site, drainage, fever, or pain not relieved by medication. ° ° ° °4) Additional Instructions: ° ° ° ° ° ° ° °Please contact your physician with any problems or Same Day Surgery at [phone], Monday through Friday 6 am to 4 pm, or [place] [facility] [place] Main number at [phone].AMBULATORY SURGERY  °DISCHARGE INSTRUCTIONS ° ° °5) The drugs that you were given will stay in your system until tomorrow so for the next 24 hours you should not: ° °D) Drive an automobile °E) Make any legal decisions °F) Drink any alcoholic beverage ° ° °6) You may resume regular meals tomorrow.  Today it is better to start with liquids and gradually work up to solid foods. ° °You may eat anything you prefer, but it is better to start with liquids, then soup and crackers, and gradually work up to solid foods. ° ° °7) Please notify your doctor immediately if you have any unusual bleeding, trouble breathing, redness and pain at the surgery site, drainage, fever, or pain not relieved by medication. ° ° ° °8) Additional Instructions: ° ° ° ° ° ° ° °Please contact your physician with any problems or Same Day Surgery at [phone], Monday through Friday 6 am to 4 pm, or  at [place] Main number at [phone]. °

## 2015-04-06 NOTE — Anesthesia Procedure Notes (Signed)
Procedure Name: MAC Performed by: Bianna Haran Pre-anesthesia Checklist: Patient identified, Emergency Drugs available, Suction available, Patient being monitored and Timeout performed Oxygen Delivery Method: Nasal cannula       

## 2015-04-06 NOTE — H&P (Signed)
See scanned note.

## 2015-04-06 NOTE — Interval H&P Note (Signed)
History and Physical Interval Note:  04/06/2015 7:25 AM  Jesus White  has presented today for surgery, with the diagnosis of cataract  The various methods of treatment have been discussed with the patient and family. After consideration of risks, benefits and other options for treatment, the patient has consented to  Procedure(s): CATARACT EXTRACTION PHACO AND INTRAOCULAR LENS PLACEMENT (IOC) (Left) as a surgical intervention .  The patient's history has been reviewed, patient examined, no change in status, stable for surgery.  I have reviewed the patient's chart and labs.  Questions were answered to the patient's satisfaction.     [name]

## 2015-04-06 NOTE — Anesthesia Postprocedure Evaluation (Signed)
Anesthesia Post Note  Patient: Jesus White  Procedure(s) Performed: Procedure(s) (LRB): CATARACT EXTRACTION PHACO AND INTRAOCULAR LENS PLACEMENT (IOC) (Left)  Patient location during evaluation: Short Stay Anesthesia Type: MAC Level of consciousness: awake and alert and oriented Pain management: satisfactory to patient Vital Signs Assessment: post-procedure vital signs reviewed and stable Respiratory status: respiratory function stable Cardiovascular status: stable Anesthetic complications: no    Last Vitals:  Filed Vitals:   04/06/15 0643  BP: 144/78  Pulse: 83  Temp: 36.5 C  Resp: 16    Last Pain: There were no vitals filed for this visit.               Clydene PughBeane, Jonell Brumbaugh D

## 2015-04-06 NOTE — Transfer of Care (Signed)
Immediate Anesthesia Transfer of Care Note  Patient: Jesus White  Procedure(s) Performed: Procedure(s) with comments: CATARACT EXTRACTION PHACO AND INTRAOCULAR LENS PLACEMENT (IOC) (Left) - US      1:11.7 AP%    58.72 CDE     32.29 fluid casette  lot # [id number] H  exp 10/2015  Patient Location: Short Stay  Anesthesia Type:MAC  Level of Consciousness: awake, alert  and oriented  Airway & Oxygen Therapy: Patient Spontanous Breathing  Post-op Assessment: Report given to RN and Post -op Vital signs reviewed and stable  Post vital signs: Reviewed and stable  Last Vitals:  Filed Vitals:   04/06/15 0643  BP: 144/78  Pulse: 83  Temp: 36.5 C  Resp: 16    Complications: No apparent anesthesia complications

## 2015-04-06 NOTE — Op Note (Signed)
Date of Surgery: 04/06/2015 Date of Dictation: 04/06/2015 9:12 AM Pre-operative Diagnosis:  Nuclear Sclerotic Cataract left Eye Post-operative Diagnosis: same Procedure performed: Extra-capsular Cataract Extraction (ECCE) with placement of a posterior chamber intraocular lens (IOL) left Eye IOL:  Implant Name Type Inv. Item Serial No. Manufacturer Lot No. LRB No. Used  LENS IOL ACRYSOF IQ 20.0 - [id number] Intraocular Lens LENS IOL ACRYSOF IQ 20.0 [id number] ALCON   Left 1   Anesthesia: 2% Lidocaine and 4% Marcaine in a 50/50 mixture with 10 unites/ml of Hylenex given as a peribulbar Anesthesiologist: Anesthesiologist: Gijsbertus Georgana CurioF Van Staveren, MD CRNA: Malva Coganatherine Beane, CRNA Complications: none Estimated Blood Loss: less than 1 ml  Description of procedure:  The patient was given anesthesia and sedation via intravenous access. The patient was then prepped and draped in the usual fashion. A 25-gauge needle was bent for initiating the capsulorhexis. A 5-0 silk suture was placed through the conjunctiva superior and inferiorly to serve as bridle sutures. Hemostasis was obtained at the superior limbus using an eraser cautery. A partial thickness groove was made at the anterior surgical limbus with a 64 Beaver blade and this was dissected anteriorly with an SYSCOlcon Crescent knife. The anterior chamber was entered at 10 o'clock with a 1.0 mm paracentesis knife and through the lamellar dissection with a 2.6 mm Alcon keratome. Epi-Shugarcaine 0.5 CC [9 cc BSS Plus (Alcon), 3 cc 4% preservative-free lidocaine (Hospira) and 4 cc 1:1000 preservative-free, bisulfite-free epinephrine] was injected into the anterior chamber via the paracentesis tract. Epi-Shugarcaine 0.5 CC [9 cc BSS Plus (Alcon), 3 cc 4% preservative-free lidocaine (Hospira) and 4 cc 1:1000 preservative-free, bisulfite-free epinephrine] was injected into the anterior chamber via the paracentesis tract. DiscoVisc was injected to replace the  aqueous and a continuous tear curvilinear capsulorhexis was performed using a bent 25-gauge needle.  Balance salt on a syringe was used to perform hydro-dissection and phacoemulsification was carried out using a divide and conquer technique. Procedure(s) with comments: CATARACT EXTRACTION PHACO AND INTRAOCULAR LENS PLACEMENT (IOC) (Left) - US      1:11.7 AP%    58.72 CDE     32.29 fluid casette  lot # [id number] H  exp 10/2015. Irrigation/aspiration was used to remove the residual cortex and the capsular bag was inflated with DiscoVisc. The intraocular lens was inserted into the capsular bag using a pre-loaded UltraSert Delivery System. Irrigation/aspiration was used to remove the residual DiscoVisc. The wound was inflated with balanced salt and checked for leaks. None were found. Miostat was injected via the paracentesis track and 0.1 ml of cefuroxime containing 1 mg of drug  was injected via the paracentesis track. The wound was checked for leaks again and none were found.   The bridal sutures were removed and two drops of Vigamox were placed on the eye. An eye shield was placed to protect the eye and the patient was discharged to the recovery area in good condition.   Ayrianna Mcginniss MD

## 2015-06-13 ENCOUNTER — Encounter: Payer: Self-pay | Admitting: Ophthalmology

## 2016-12-14 ENCOUNTER — Other Ambulatory Visit: Payer: Self-pay

## 2016-12-14 ENCOUNTER — Emergency Department
Admission: EM | Admit: 2016-12-14 | Discharge: 2016-12-14 | Disposition: A | Discharge status: Home / Self Care | Payer: Medicare Other | Attending: Student in an Organized Health Care Education/Training Program | Admitting: Student in an Organized Health Care Education/Training Program

## 2016-12-14 ENCOUNTER — Emergency Department: Payer: Medicare Other

## 2016-12-14 DIAGNOSIS — E119 Type 2 diabetes mellitus without complications: Secondary | ICD-10-CM | POA: Insufficient documentation

## 2016-12-14 DIAGNOSIS — I2510 Atherosclerotic heart disease of native coronary artery without angina pectoris: Secondary | ICD-10-CM | POA: Insufficient documentation

## 2016-12-14 DIAGNOSIS — I10 Essential (primary) hypertension: Secondary | ICD-10-CM | POA: Insufficient documentation

## 2016-12-14 DIAGNOSIS — I891 Lymphangitis: Secondary | ICD-10-CM

## 2016-12-14 DIAGNOSIS — L03115 Cellulitis of right lower limb: Secondary | ICD-10-CM | POA: Diagnosis not present

## 2016-12-14 DIAGNOSIS — M79661 Pain in right lower leg: Secondary | ICD-10-CM | POA: Diagnosis present

## 2016-12-14 DIAGNOSIS — Z79899 Other long term (current) drug therapy: Secondary | ICD-10-CM | POA: Diagnosis not present

## 2016-12-14 DIAGNOSIS — L03125 Acute lymphangitis of right lower limb: Secondary | ICD-10-CM | POA: Insufficient documentation

## 2016-12-14 LAB — COMPREHENSIVE METABOLIC PANEL
ALBUMIN: 4.5 g/dL (ref 3.5–5.0)
ALT: 22 U/L (ref 17–63)
ANION GAP: 10 (ref 5–15)
AST: 28 U/L (ref 15–41)
Alkaline Phosphatase: 59 U/L (ref 38–126)
BUN: 20 mg/dL (ref 6–20)
CO2: 24 mmol/L (ref 22–32)
Calcium: 9.4 mg/dL (ref 8.9–10.3)
Chloride: 98 mmol/L — ABNORMAL LOW (ref 101–111)
Creatinine, Ser: 0.82 mg/dL (ref 0.61–1.24)
GFR calc Af Amer: >60 mL/min (ref >60)
GFR calc non Af Amer: >60 mL/min (ref >60)
GLUCOSE: 137 mg/dL — AB (ref 65–99)
Potassium: 3.4 mmol/L — ABNORMAL LOW (ref 3.5–5.1)
SODIUM: 132 mmol/L — AB (ref 135–145)
TOTAL PROTEIN: 7.8 g/dL (ref 6.5–8.1)
Total Bilirubin: 1.4 mg/dL — ABNORMAL HIGH (ref 0.3–1.2)

## 2016-12-14 LAB — CBC
HCT: 42.5 % (ref 40.0–52.0)
Hemoglobin: 14.7 g/dL (ref 13.0–18.0)
MCH: 33 pg (ref 26.0–34.0)
MCHC: 34.5 g/dL (ref 32.0–36.0)
MCV: 95.5 fL (ref 80.0–100.0)
Platelets: 199 10*3/uL (ref 150–440)
RBC: 4.45 MIL/uL (ref 4.40–5.90)
RDW: 13.1 % (ref 11.5–14.5)
WBC: 14.5 10*3/uL — AB (ref 3.8–10.6)

## 2016-12-14 MED ORDER — CEFTRIAXONE SODIUM IN DEXTROSE 20 MG/ML IV SOLN
1.0000 g | Freq: Once | INTRAVENOUS | Status: AC
  Administered 2016-12-14: 1 g via INTRAVENOUS
  Filled 2016-12-14: qty 50

## 2016-12-14 MED ORDER — CEPHALEXIN 500 MG PO CAPS: 500.0000 mg | ORAL_CAPSULE | Freq: Three times a day (TID) | ORAL | 0 refills | Status: AC

## 2016-12-14 MED ORDER — CLINDAMYCIN PHOSPHATE 600 MG/50ML IV SOLN
600.0000 mg | Freq: Once | INTRAVENOUS | Status: AC
  Administered 2016-12-14: 600 mg via INTRAVENOUS
  Filled 2016-12-14 (×2): qty 50

## 2016-12-14 MED ORDER — TRAMADOL HCL 50 MG PO TABS: 50.0000 mg | ORAL_TABLET | Freq: Four times a day (QID) | ORAL | 0 refills | Status: AC | PRN

## 2016-12-14 MED ORDER — SODIUM CHLORIDE 0.9 % IV BOLUS (SEPSIS)
1000.0000 mL | Freq: Once | INTRAVENOUS | Status: AC
  Administered 2016-12-14: 1000 mL via INTRAVENOUS

## 2016-12-14 NOTE — ED Notes (Signed)
Pharmacy called for IV Clindamycin

## 2016-12-14 NOTE — ED Notes (Signed)
Still waiting for IV Clindamycin. Pharmacy called again.

## 2016-12-14 NOTE — ED Provider Notes (Signed)
[facility] Emergency Department Provider Note    First MD Initiated Contact with Patient 12/14/16 1803     (approximate)  I have reviewed the triage vital signs and the nursing notes.   HISTORY  Chief Complaint Cellulitis    HPI Jesus White is a 71 y.o. male with history of hypertension but no history of diabetes presents with acute pain and redness to the right shin radiating up behind his right knee with increasing redness throughout the day.  Patient states he did injure that shin roughly 1 month ago while getting into his car and has had persistent scab over that area.  No injury in the past day or 2.  States he noticed redness on the anterior shin yesterday but became progressively worse with the burning mild to moderate pain today.  No fevers or nausea.  No history of blood clots or dysrhythmia.  He is not on any anticoagulation.  No numbness or tingling in the lower extremity.  No recent antibiotics.  Past Medical History:  Diagnosis Date  . Anxiety   . Arthritis   . Atherosclerosis   . Cholelithiasis with choledocholithiasis   . Diabetes mellitus without complication (HCC)   . Diverticulosis   . GERD (gastroesophageal reflux disease)   . Hyperlipemia   . Hypertension   . Liver lesion   . Sepsis (HCC)    No family history on file. Past Surgical History:  Procedure Laterality Date  . CATARACT EXTRACTION W/PHACO Left 04/06/2015   Procedure: CATARACT EXTRACTION PHACO AND INTRAOCULAR LENS PLACEMENT (IOC);  Surgeon: Sallee LangeSteven Dingeldein, MD;  Location: [facility] ORS;  Service: Ophthalmology;  Laterality: Left;  US      1:11.7 AP%    58.72 CDE     32.29 fluid casette  lot # [id number] H  exp 10/2015  . CHOLECYSTECTOMY N/A 11/07/2014   Procedure: LAPAROSCOPIC CHOLECYSTECTOMY;  Surgeon: Abigail Miyamotoouglas Blackman, MD;  [facility];  Service: General;  Laterality: N/A;  . COLONOSCOPY    . ERCP N/A 11/05/2014   Procedure: ENDOSCOPIC RETROGRADE  CHOLANGIOPANCREATOGRAPHY (ERCP);  Surgeon: Dorena CookeyJohn Hayes, MD;  [facility];  Service: Gastroenterology;  Laterality: N/A;  . ERCP N/A 11/05/2014   Procedure: ENDOSCOPIC RETROGRADE CHOLANGIOPANCREATOGRAPHY (ERCP);  Surgeon: Dorena CookeyJohn Hayes, MD;  [facility];  Service: Endoscopy;  Laterality: N/A;  . EYE SURGERY    . HEMORROIDECTOMY     Patient Active Problem List   Diagnosis Date Noted  . Sepsis (HCC) 11/04/2014  . Cholelithiasis with choledocholithiasis 11/04/2014  . Elevated transaminase level 11/04/2014  . Hypokalemia 11/04/2014  . Liver lesion 11/04/2014  . Coronary atherosclerosis 11/04/2014  . SIRS (systemic inflammatory response syndrome) (HCC) 11/04/2014  . Acute encephalopathy 11/04/2014  . Lactic acidosis 11/04/2014  . Hypertension   . Hyperlipemia       Prior to Admission medications   Medication Sig Start Date End Date Taking? Authorizing Provider  acetaminophen (TYLENOL) 500 MG tablet Take 1,000 mg by mouth every 8 (eight) hours as needed (pain).    [provider]  amLODipine (NORVASC) 10 MG tablet Take 10 mg by mouth at bedtime. 09/22/14   [provider]  cephALEXin (KEFLEX) 500 MG capsule Take 1 capsule (500 mg total) by mouth 3 (three) times daily for 7 days. 12/14/16 12/21/16  Willy Eddyobinson, Shacarra Choe, MD  hydrochlorothiazide (HYDRODIURIL) 25 MG tablet Take 25 mg by mouth at bedtime. 08/18/14   [provider]  HYDROcodone-acetaminophen (NORCO/VICODIN) 5-325 MG tablet Take 1 tablet by mouth every 6 (  six) hours as needed for moderate pain. 11/08/14   Rai, Ripudeep K, MD  KRILL OIL PO Take 1 capsule by mouth daily.    [provider]  losartan (COZAAR) 50 MG tablet Take 75 mg by mouth at bedtime.    [provider]  lovastatin (MEVACOR) 40 MG tablet Take 2 tablets (80 mg total) by mouth at bedtime. HOLD until LFT's are normalized 11/08/14   Rai, Ripudeep K, MD  Multiple Vitamin (MULTIVITAMIN WITH MINERALS) TABS tablet Take 1  tablet by mouth daily.    [provider]  omeprazole (PRILOSEC) 20 MG capsule Take 20 mg by mouth daily. 09/04/14   [provider]  PARoxetine (PAXIL) 20 MG tablet Take 20 mg by mouth at bedtime.    [provider]  promethazine (PHENERGAN) 12.5 MG tablet Take 1 tablet (12.5 mg total) by mouth every 6 (six) hours as needed for nausea or vomiting. Patient not taking: Reported on 04/06/2015 11/08/14   Rai, Delene Ruffini, MD  traMADol (ULTRAM) 50 MG tablet Take 1 tablet (50 mg total) by mouth every 6 (six) hours as needed. 12/14/16 12/14/17  Willy Eddy, MD    Allergies Patient has no known allergies.    Social History Social History   Tobacco Use  . Smoking status: Never Smoker  Substance Use Topics  . Alcohol use: No  . Drug use: Not on file    Review of Systems Patient denies headaches, rhinorrhea, blurry vision, numbness, shortness of breath, chest pain, edema, cough, abdominal pain, nausea, vomiting, diarrhea, dysuria, fevers, rashes or hallucinations unless otherwise stated above in HPI. ____________________________________________   PHYSICAL EXAM:  VITAL SIGNS: Vitals:   12/14/16 1834 12/14/16 1900  BP: (!) 150/73 (!) 159/80  Pulse: (!) 103 98  Resp: 16 16  Temp:    SpO2: 95% 97%    Constitutional: Alert and oriented. Well appearing and in no acute distress. Eyes: Conjunctivae are normal.  Head: Atraumatic. Nose: No congestion/rhinnorhea. Mouth/Throat: Mucous membranes are moist.   Neck: No stridor. Painless ROM.  Cardiovascular: Normal rate, regular rhythm. Grossly normal heart sounds.  Good peripheral circulation. Respiratory: Normal respiratory effort.  No retractions. Lungs CTAB. Gastrointestinal: Soft and nontender. No distention. No abdominal bruits. No CVA tenderness. Genitourinary:  Musculoskeletal: Erythema and streaking redness consistent with lymphangitis on the right lower extremity.  No joint effusions.  Painless range  of motion of the joint.  No swelling or edema Neurologic:  Normal speech and language. No gross focal neurologic deficits are appreciated. No facial droop Skin:  Skin is warm, dry and intact. No rash noted. Psychiatric: Mood and affect are normal. Speech and behavior are normal.  ____________________________________________   LABS (all labs ordered are listed, but only abnormal results are displayed)  Results for orders placed or performed during the hospital encounter of 12/14/16 (from the past 24 hour(s))  CBC     Status: Abnormal   Collection Time: 12/14/16  5:05 PM  Result Value Ref Range   WBC 14.5 (H) 3.8 - 10.6 K/uL   RBC 4.45 4.40 - 5.90 MIL/uL   Hemoglobin 14.7 13.0 - 18.0 g/dL   HCT 16.1 09.6 - 04.5 %   MCV 95.5 80.0 - 100.0 fL   MCH 33.0 26.0 - 34.0 pg   MCHC 34.5 32.0 - 36.0 g/dL   RDW 40.9 81.1 - 91.4 %   Platelets 199 150 - 440 K/uL  Comprehensive metabolic panel     Status: Abnormal   Collection Time: 12/14/16  5:05 PM  Result Value Ref Range   Sodium 132 (L) 135 - 145 mmol/L   Potassium 3.4 (L) 3.5 - 5.1 mmol/L   Chloride 98 (L) 101 - 111 mmol/L   CO2 24 22 - 32 mmol/L   Glucose, Bld 137 (H) 65 - 99 mg/dL   BUN 20 6 - 20 mg/dL   Creatinine, Ser 5.780.82 0.61 - 1.24 mg/dL   Calcium 9.4 8.9 - 46.910.3 mg/dL   Total Protein 7.8 6.5 - 8.1 g/dL   Albumin 4.5 3.5 - 5.0 g/dL   AST 28 15 - 41 U/L   ALT 22 17 - 63 U/L   Alkaline Phosphatase 59 38 - 126 U/L   Total Bilirubin 1.4 (H) 0.3 - 1.2 mg/dL   GFR calc non Af Amer >60 >60 mL/min   GFR calc Af Amer >60 >60 mL/min   Anion gap 10 5 - 15   ____________________________________________ ____________________________________________  RADIOLOGY  I personally reviewed all radiographic images ordered to evaluate for the above acute complaints and reviewed radiology reports and findings.  These findings were personally discussed with the patient.  Please see medical record for radiology  report.  ____________________________________________   PROCEDURES  Procedure(s) performed:  Procedures    Critical Care performed: no ____________________________________________   INITIAL IMPRESSION / ASSESSMENT AND PLAN / ED COURSE  Pertinent labs & imaging results that were available during my care of the patient were reviewed by me and considered in my medical decision making (see chart for details).  DDX: celluliits, abscess, erythroderma, osteo, vasculitis, lymphangitis  Cedar H Chestine SporeClark is a 71 y.o. who presents to the ED with cellulitis and lymphangitis of the right lower extremity.  Patient otherwise well-appearing.  Blood work sent out of triage does have mild leukocytosis which would be appropriate given his evidence of cellulitis.  This is not clinically consistent with septic arthritis.  He is well perfused distally.  Not clinically consistent with necrotizing soft tissue infection.  Not clinically consistent with DVT.  Patient will be given single dose of IV antibiotics as well as IV fluids for mild tachycardia.  Patient without any hypoxia or metabolic acidosis.  Patient has good follow-up with primary care and is he is otherwise afebrile and well-appearing I do believe the patient will be appropriate for trial of outpatient management.  Have discussed with the patient and available family all diagnostics and treatments performed thus far and all questions were answered to the best of my ability. The patient demonstrates understanding and agreement with plan.       ____________________________________________   FINAL CLINICAL IMPRESSION(S) / ED DIAGNOSES  Final diagnoses:  Cellulitis of right lower extremity  Lymphangitis      NEW MEDICATIONS STARTED DURING THIS VISIT:  This SmartLink is deprecated. Use AVSMEDLIST instead to display the medication list for a patient.   Note:  This document was prepared using Dragon voice recognition software and may include  unintentional dictation errors.    Willy Eddyobinson, Gabrian Hoque, MD 12/14/16 [phone]

## 2016-12-14 NOTE — ED Notes (Signed)

## 2016-12-14 NOTE — ED Triage Notes (Signed)
Pt reports scraping his rt shin on the car dash a month ago, state that today he noticed pain moving up behind rt knee with increased redness

## 2017-02-06 DIAGNOSIS — R7303 Prediabetes: Secondary | ICD-10-CM | POA: Insufficient documentation

## 2018-08-18 ENCOUNTER — Ambulatory Visit
Admission: RE | Admit: 2018-08-18 | Discharge: 2018-08-18 | Disposition: A | Discharge status: Home / Self Care | Payer: Medicare Other | Source: Ambulatory Visit | Attending: Physician Assistant | Admitting: Physician Assistant

## 2018-08-18 ENCOUNTER — Other Ambulatory Visit: Payer: Self-pay | Admitting: Physician Assistant

## 2018-08-18 ENCOUNTER — Other Ambulatory Visit: Payer: Self-pay

## 2018-08-18 DIAGNOSIS — M79604 Pain in right leg: Secondary | ICD-10-CM

## 2018-08-30 ENCOUNTER — Other Ambulatory Visit: Payer: Self-pay

## 2018-08-30 ENCOUNTER — Encounter
Admission: RE | Admit: 2018-08-30 | Discharge: 2018-08-30 | Disposition: A | Discharge status: Home / Self Care | Payer: Medicare Other | Source: Ambulatory Visit | Attending: Orthopedic Surgery | Admitting: Orthopedic Surgery

## 2018-08-30 DIAGNOSIS — Z01818 Encounter for other preprocedural examination: Secondary | ICD-10-CM | POA: Insufficient documentation

## 2018-08-30 DIAGNOSIS — R9431 Abnormal electrocardiogram [ECG] [EKG]: Secondary | ICD-10-CM | POA: Diagnosis not present

## 2018-08-30 HISTORY — DX: Hyperlipidemia, unspecified: E78.5

## 2018-08-30 LAB — URINALYSIS, ROUTINE W REFLEX MICROSCOPIC
Bilirubin Urine: NEGATIVE
Glucose, UA: NEGATIVE mg/dL
Hgb urine dipstick: NEGATIVE
Ketones, ur: NEGATIVE mg/dL
Leukocytes,Ua: NEGATIVE
Nitrite: NEGATIVE
Protein, ur: NEGATIVE mg/dL
Specific Gravity, Urine: 1.011 (ref 1.005–1.030)
pH: 7 (ref 5.0–8.0)

## 2018-08-30 LAB — COMPREHENSIVE METABOLIC PANEL
ALT: 26 U/L (ref 0–44)
AST: 22 U/L (ref 15–41)
Albumin: 4.5 g/dL (ref 3.5–5.0)
Alkaline Phosphatase: 77 U/L (ref 38–126)
Anion gap: 9 (ref 5–15)
BUN: 14 mg/dL (ref 8–23)
CO2: 28 mmol/L (ref 22–32)
Calcium: 9.1 mg/dL (ref 8.9–10.3)
Chloride: 101 mmol/L (ref 98–111)
Creatinine, Ser: 0.78 mg/dL (ref 0.61–1.24)
GFR calc Af Amer: >60 mL/min (ref >60)
GFR calc non Af Amer: >60 mL/min (ref >60)
Glucose, Bld: 98 mg/dL (ref 70–99)
Potassium: 3.7 mmol/L (ref 3.5–5.1)
Sodium: 138 mmol/L (ref 135–145)
Total Bilirubin: 1 mg/dL (ref 0.3–1.2)
Total Protein: 7.4 g/dL (ref 6.5–8.1)

## 2018-08-30 LAB — CBC
HCT: 42.6 % (ref 39.0–52.0)
Hemoglobin: 14.3 g/dL (ref 13.0–17.0)
MCH: 32.4 pg (ref 26.0–34.0)
MCHC: 33.6 g/dL (ref 30.0–36.0)
MCV: 96.6 fL (ref 80.0–100.0)
Platelets: 224 10*3/uL (ref 150–400)
RBC: 4.41 MIL/uL (ref 4.22–5.81)
RDW: 11.9 % (ref 11.5–15.5)
WBC: 6.4 10*3/uL (ref 4.0–10.5)
nRBC: 0 % (ref 0.0–0.2)

## 2018-08-30 LAB — APTT: aPTT: 31 seconds (ref 24–36)

## 2018-08-30 LAB — C-REACTIVE PROTEIN: CRP: <0.8 mg/dL (ref <1.0)

## 2018-08-30 LAB — PROTIME-INR
INR: 0.9 (ref 0.8–1.2)
Prothrombin Time: 12.3 seconds (ref 11.4–15.2)

## 2018-08-30 LAB — SURGICAL PCR SCREEN
MRSA, PCR: NEGATIVE
Staphylococcus aureus: POSITIVE — AB

## 2018-08-30 LAB — SEDIMENTATION RATE: Sed Rate: 18 mm/hr (ref 0–20)

## 2018-08-30 MED ORDER — ENSURE PRE-SURGERY PO LIQD
296.0000 mL | Freq: Once | ORAL | Status: DC
  Filled 2018-08-30: qty 296

## 2018-08-30 NOTE — Patient Instructions (Signed)
Your procedure is scheduled on: 09/06/2018 Mon Report to Same Day Surgery 2nd floor medical mall [facility] Entrance-take elevator on left to 2nd floor.  Check in with surgery information desk.) To find out your arrival time please call [phone] between 1PM - 3PM on 09/03/2018 Fri  Remember: Instructions that are not followed completely may result in serious medical risk, up to and including death, or upon the discretion of your surgeon and anesthesiologist your surgery may need to be rescheduled.    _x___ 1. Do not eat food after midnight the night before your procedure. You may drink clear liquids up to 2 hours before you are scheduled to arrive at the hospital for your procedure.  Do not drink clear liquids within 2 hours of your scheduled arrival to the hospital.  Clear liquids include  --Water or Apple juice without pulp  --Clear carbohydrate beverage such as ClearFast or Gatorade  --Black Coffee or Clear Tea (No milk, no creamers, do not add anything to                  the coffee or Tea Type 1 and type 2 diabetics should only drink water.   ____Ensure clear carbohydrate drink on the way to the hospital for bariatric patients  ____Ensure clear carbohydrate drink 3 hours before surgery.   No gum chewing or hard candies.     __x__ 2. No Alcohol for 24 hours before or after surgery.   __x__3. No Smoking or e-cigarettes for 24 prior to surgery.  Do not use any chewable tobacco products for at least 6 hour prior to surgery   ____  4. Bring all medications with you on the day of surgery if instructed.    __x__ 5. Notify your doctor if there is any change in your medical condition     (cold, fever, infections).    x___6. On the morning of surgery brush your teeth with toothpaste and water.  You may rinse your mouth with mouth wash if you wish.  Do not swallow any toothpaste or mouthwash.   Do not wear jewelry, make-up, hairpins, clips or nail polish.  Do not wear lotions,  powders, or perfumes. You may wear deodorant.  Do not shave 48 hours prior to surgery. Men may shave face and neck.  Do not bring valuables to the hospital.    [facility] is not responsible for any belongings or valuables.               Contacts, dentures or bridgework may not be worn into surgery.  Leave your suitcase in the car. After surgery it may be brought to your room.  For patients admitted to the hospital, discharge time is determined by your                       treatment team.  _  Patients discharged the day of surgery will not be allowed to drive home.  You will need someone to drive you home and stay with you the night of your procedure.    Please read over the following fact sheets that you were given:   [facility] Preparing for Surgery and or MRSA Information   _x___ Take anti-hypertensive listed below, cardiac, seizure, asthma,     anti-reflux and psychiatric medicines. These include:  1. amLODipine (NORVASC) 10 MG tablet  2.omeprazole (PRILOSEC) 20 MG capsule  3.PARoxetine (PAXIL) 20 MG tablet  4.  5.  6.  ____Fleets enema  or Magnesium Citrate as directed.   _x___ Use CHG Soap or sage wipes as directed on instruction sheet   ____ Use inhalers on the day of surgery and bring to hospital day of surgery  ____ Stop Metformin and Janumet 2 days prior to surgery.    ____ Take 1/2 of usual insulin dose the night before surgery and none on the morning     surgery.   _x___ Follow recommendations from Cardiologist, Pulmonologist or PCP regarding          stopping Aspirin, Coumadin, Plavix ,Eliquis, Effient, or Pradaxa, and Pletal.  X____Stop Anti-inflammatories such as Advil, Aleve, Ibuprofen, Motrin, Naproxen, Naprosyn, Goodies powders or aspirin products. OK to take Tylenol and                          Celebrex. Stop meloxicam today   _x___ Stop supplements until after surgery.  But may continue Vitamin D, Vitamin B,       and multivitamin. Stop fish oils  today.   ____ Bring C-Pap to the hospital.

## 2018-08-31 LAB — URINE CULTURE
Culture: NO GROWTH
Special Requests: NORMAL

## 2018-08-31 NOTE — Pre-Procedure Instructions (Addendum)
Positive staph aureus results sent to Dr. Hooten for review.  Asked if wanted any treatment? 

## 2018-09-01 LAB — TYPE AND SCREEN
ABO/RH(D): O POS
Antibody Screen: NEGATIVE

## 2018-09-01 NOTE — Pre-Procedure Instructions (Signed)
Request/ekg called and faxed to tiffany at dr hooten's

## 2018-09-02 ENCOUNTER — Other Ambulatory Visit
Admission: RE | Admit: 2018-09-02 | Discharge: 2018-09-02 | Disposition: A | Discharge status: Home / Self Care | Payer: Medicare Other | Source: Ambulatory Visit | Attending: Orthopedic Surgery | Admitting: Orthopedic Surgery

## 2018-09-02 ENCOUNTER — Other Ambulatory Visit: Payer: Self-pay

## 2018-09-02 DIAGNOSIS — Z20828 Contact with and (suspected) exposure to other viral communicable diseases: Secondary | ICD-10-CM | POA: Insufficient documentation

## 2018-09-02 DIAGNOSIS — Z01812 Encounter for preprocedural laboratory examination: Secondary | ICD-10-CM | POA: Insufficient documentation

## 2018-09-02 LAB — SARS CORONAVIRUS 2 (TAT 6-24 HRS): SARS Coronavirus 2: NEGATIVE

## 2018-09-05 MED ORDER — TRANEXAMIC ACID-NACL 1000-0.7 MG/100ML-% IV SOLN
1000.0000 mg | INTRAVENOUS | Status: AC
  Administered 2018-09-06: 1000 mg via INTRAVENOUS
  Filled 2018-09-05: qty 100

## 2018-09-05 MED ORDER — CEFAZOLIN SODIUM-DEXTROSE 2-4 GM/100ML-% IV SOLN
2.0000 g | INTRAVENOUS | Status: AC
  Administered 2018-09-06: 2 g via INTRAVENOUS

## 2018-09-06 ENCOUNTER — Inpatient Hospital Stay: Payer: Medicare Other

## 2018-09-06 ENCOUNTER — Inpatient Hospital Stay: Payer: Medicare Other | Admitting: Anesthesiology

## 2018-09-06 ENCOUNTER — Inpatient Hospital Stay
Admission: RE | Admit: 2018-09-06 | Discharge: 2018-09-08 | DRG: 470 | Disposition: A | Discharge status: Home Health | Payer: Medicare Other | Source: Ambulatory Visit | Attending: Orthopedic Surgery | Admitting: Orthopedic Surgery

## 2018-09-06 ENCOUNTER — Encounter
Admission: RE | Disposition: A | Discharge status: Home Health | Payer: Self-pay | Source: Ambulatory Visit | Attending: Orthopedic Surgery

## 2018-09-06 ENCOUNTER — Encounter: Payer: Self-pay | Admitting: Orthopedic Surgery

## 2018-09-06 ENCOUNTER — Other Ambulatory Visit: Payer: Self-pay

## 2018-09-06 DIAGNOSIS — M1711 Unilateral primary osteoarthritis, right knee: Secondary | ICD-10-CM | POA: Diagnosis present

## 2018-09-06 DIAGNOSIS — E785 Hyperlipidemia, unspecified: Secondary | ICD-10-CM | POA: Diagnosis present

## 2018-09-06 DIAGNOSIS — Z79899 Other long term (current) drug therapy: Secondary | ICD-10-CM

## 2018-09-06 DIAGNOSIS — Z96651 Presence of right artificial knee joint: Secondary | ICD-10-CM

## 2018-09-06 DIAGNOSIS — I10 Essential (primary) hypertension: Secondary | ICD-10-CM | POA: Diagnosis present

## 2018-09-06 DIAGNOSIS — Z96659 Presence of unspecified artificial knee joint: Secondary | ICD-10-CM

## 2018-09-06 DIAGNOSIS — E119 Type 2 diabetes mellitus without complications: Secondary | ICD-10-CM | POA: Diagnosis present

## 2018-09-06 DIAGNOSIS — F419 Anxiety disorder, unspecified: Secondary | ICD-10-CM | POA: Insufficient documentation

## 2018-09-06 DIAGNOSIS — K219 Gastro-esophageal reflux disease without esophagitis: Secondary | ICD-10-CM | POA: Diagnosis present

## 2018-09-06 DIAGNOSIS — E7800 Pure hypercholesterolemia, unspecified: Secondary | ICD-10-CM | POA: Insufficient documentation

## 2018-09-06 HISTORY — PX: KNEE ARTHROPLASTY: SHX992

## 2018-09-06 LAB — ABO/RH: ABO/RH(D): O POS

## 2018-09-06 SURGERY — ARTHROPLASTY, KNEE, TOTAL, USING IMAGELESS COMPUTER-ASSISTED NAVIGATION
Anesthesia: Spinal | Site: Knee | Laterality: Right

## 2018-09-06 MED ORDER — GABAPENTIN 300 MG PO CAPS
300.0000 mg | ORAL_CAPSULE | Freq: Every day | ORAL | Status: DC
  Administered 2018-09-06 – 2018-09-07 (×2): 300 mg via ORAL
  Filled 2018-09-06 (×2): qty 1

## 2018-09-06 MED ORDER — SODIUM CHLORIDE 0.9 % IV SOLN
INTRAVENOUS | Status: DC | PRN
  Administered 2018-09-06: 14:00:00 60 mL

## 2018-09-06 MED ORDER — GABAPENTIN 300 MG PO CAPS
300.0000 mg | ORAL_CAPSULE | Freq: Once | ORAL | Status: AC
  Administered 2018-09-06: 300 mg via ORAL

## 2018-09-06 MED ORDER — ONDANSETRON HCL 4 MG/2ML IJ SOLN: 4.0000 mg | Freq: Once | INTRAMUSCULAR | Status: DC | PRN

## 2018-09-06 MED ORDER — ONDANSETRON HCL 4 MG/2ML IJ SOLN
INTRAMUSCULAR | Status: AC
  Filled 2018-09-06: qty 2

## 2018-09-06 MED ORDER — HYDROMORPHONE HCL 1 MG/ML IJ SOLN: 0.5000 mg | INTRAMUSCULAR | Status: DC | PRN

## 2018-09-06 MED ORDER — OXYCODONE HCL 5 MG PO TABS: 5.0000 mg | ORAL_TABLET | ORAL | Status: DC | PRN

## 2018-09-06 MED ORDER — SENNOSIDES-DOCUSATE SODIUM 8.6-50 MG PO TABS
1.0000 | ORAL_TABLET | Freq: Two times a day (BID) | ORAL | Status: DC
  Administered 2018-09-06 – 2018-09-08 (×4): 1 via ORAL
  Filled 2018-09-06 (×4): qty 1

## 2018-09-06 MED ORDER — ACETAMINOPHEN 10 MG/ML IV SOLN
1000.0000 mg | Freq: Four times a day (QID) | INTRAVENOUS | Status: AC
  Administered 2018-09-06 – 2018-09-07 (×4): 1000 mg via INTRAVENOUS
  Filled 2018-09-06 (×4): qty 100

## 2018-09-06 MED ORDER — PAROXETINE HCL 20 MG PO TABS
20.0000 mg | ORAL_TABLET | Freq: Every day | ORAL | Status: DC
  Administered 2018-09-07 – 2018-09-08 (×2): 20 mg via ORAL
  Filled 2018-09-06 (×2): qty 1

## 2018-09-06 MED ORDER — PRAVASTATIN SODIUM 20 MG PO TABS
80.0000 mg | ORAL_TABLET | Freq: Every day | ORAL | Status: DC
  Administered 2018-09-06 – 2018-09-07 (×2): 80 mg via ORAL
  Filled 2018-09-06 (×3): qty 4

## 2018-09-06 MED ORDER — BUPIVACAINE HCL (PF) 0.25 % IJ SOLN
INTRAMUSCULAR | Status: DC | PRN
  Administered 2018-09-06: 60 mL

## 2018-09-06 MED ORDER — OMEGA-3-ACID ETHYL ESTERS 1 G PO CAPS
1.0000 g | ORAL_CAPSULE | Freq: Every day | ORAL | Status: DC
  Administered 2018-09-07 – 2018-09-08 (×2): 1 g via ORAL
  Filled 2018-09-06 (×2): qty 1

## 2018-09-06 MED ORDER — HYDROCHLOROTHIAZIDE 25 MG PO TABS
25.0000 mg | ORAL_TABLET | Freq: Every day | ORAL | Status: DC
  Administered 2018-09-07 – 2018-09-08 (×2): 25 mg via ORAL
  Filled 2018-09-06 (×2): qty 1

## 2018-09-06 MED ORDER — DEXAMETHASONE SODIUM PHOSPHATE 10 MG/ML IJ SOLN
8.0000 mg | Freq: Once | INTRAMUSCULAR | Status: AC
  Administered 2018-09-06: 8 mg via INTRAVENOUS

## 2018-09-06 MED ORDER — NEOMYCIN-POLYMYXIN B GU 40-200000 IR SOLN
Status: DC | PRN
  Administered 2018-09-06: 14 mL

## 2018-09-06 MED ORDER — ALUM & MAG HYDROXIDE-SIMETH 200-200-20 MG/5ML PO SUSP: 30.0000 mL | ORAL | Status: DC | PRN

## 2018-09-06 MED ORDER — LIDOCAINE HCL (PF) 2 % IJ SOLN
INTRAMUSCULAR | Status: AC
  Filled 2018-09-06: qty 10

## 2018-09-06 MED ORDER — METOCLOPRAMIDE HCL 10 MG PO TABS
10.0000 mg | ORAL_TABLET | Freq: Three times a day (TID) | ORAL | Status: DC
  Administered 2018-09-06 – 2018-09-08 (×6): 10 mg via ORAL
  Filled 2018-09-06 (×7): qty 1

## 2018-09-06 MED ORDER — ADULT MULTIVITAMIN W/MINERALS CH
1.0000 | ORAL_TABLET | Freq: Every day | ORAL | Status: DC
  Administered 2018-09-07 – 2018-09-08 (×2): 1 via ORAL
  Filled 2018-09-06 (×2): qty 1

## 2018-09-06 MED ORDER — LACTATED RINGERS IV SOLN
INTRAVENOUS | Status: DC
  Administered 2018-09-06 (×2): via INTRAVENOUS

## 2018-09-06 MED ORDER — BUPIVACAINE HCL (PF) 0.5 % IJ SOLN
INTRAMUSCULAR | Status: DC | PRN
  Administered 2018-09-06: 3 mL

## 2018-09-06 MED ORDER — TRAMADOL HCL 50 MG PO TABS: 50.0000 mg | ORAL_TABLET | ORAL | Status: DC | PRN

## 2018-09-06 MED ORDER — TRANEXAMIC ACID-NACL 1000-0.7 MG/100ML-% IV SOLN
1000.0000 mg | Freq: Once | INTRAVENOUS | Status: AC
  Administered 2018-09-06: 1000 mg via INTRAVENOUS
  Filled 2018-09-06: qty 100

## 2018-09-06 MED ORDER — EPHEDRINE SULFATE 50 MG/ML IJ SOLN
INTRAMUSCULAR | Status: AC
  Filled 2018-09-06: qty 1

## 2018-09-06 MED ORDER — NAPHAZOLINE-GLYCERIN 0.012-0.2 % OP SOLN
1.0000 [drp] | Freq: Four times a day (QID) | OPHTHALMIC | Status: DC | PRN
  Filled 2018-09-06: qty 15

## 2018-09-06 MED ORDER — CEFAZOLIN SODIUM-DEXTROSE 2-4 GM/100ML-% IV SOLN
2.0000 g | Freq: Four times a day (QID) | INTRAVENOUS | Status: AC
  Administered 2018-09-06 – 2018-09-07 (×4): 2 g via INTRAVENOUS
  Filled 2018-09-06 (×4): qty 100

## 2018-09-06 MED ORDER — MAGNESIUM HYDROXIDE 400 MG/5ML PO SUSP
30.0000 mL | Freq: Every day | ORAL | Status: DC
  Administered 2018-09-07: 09:00:00 30 mL via ORAL
  Filled 2018-09-06: qty 30

## 2018-09-06 MED ORDER — DIPHENHYDRAMINE HCL 12.5 MG/5ML PO ELIX: 12.5000 mg | ORAL_SOLUTION | ORAL | Status: DC | PRN

## 2018-09-06 MED ORDER — MIDAZOLAM HCL 2 MG/2ML IJ SOLN
INTRAMUSCULAR | Status: AC
  Filled 2018-09-06: qty 2

## 2018-09-06 MED ORDER — LOSARTAN POTASSIUM 25 MG PO TABS
75.0000 mg | ORAL_TABLET | Freq: Every day | ORAL | Status: DC
  Administered 2018-09-07 – 2018-09-08 (×2): 75 mg via ORAL
  Filled 2018-09-06 (×2): qty 3

## 2018-09-06 MED ORDER — BISACODYL 10 MG RE SUPP: 10.0000 mg | Freq: Every day | RECTAL | Status: DC | PRN

## 2018-09-06 MED ORDER — AMLODIPINE BESYLATE 10 MG PO TABS
10.0000 mg | ORAL_TABLET | Freq: Every day | ORAL | Status: DC
  Administered 2018-09-07 – 2018-09-08 (×2): 10 mg via ORAL
  Filled 2018-09-06 (×2): qty 1

## 2018-09-06 MED ORDER — SODIUM CHLORIDE 0.9 % IV SOLN
INTRAVENOUS | Status: DC
  Administered 2018-09-06: 17:00:00 via INTRAVENOUS

## 2018-09-06 MED ORDER — BUPIVACAINE HCL (PF) 0.5 % IJ SOLN
INTRAMUSCULAR | Status: AC
  Filled 2018-09-06: qty 10

## 2018-09-06 MED ORDER — ENOXAPARIN SODIUM 30 MG/0.3ML ~~LOC~~ SOLN
30.0000 mg | Freq: Two times a day (BID) | SUBCUTANEOUS | Status: DC
  Administered 2018-09-07 – 2018-09-08 (×3): 30 mg via SUBCUTANEOUS
  Filled 2018-09-06 (×3): qty 0.3

## 2018-09-06 MED ORDER — ONDANSETRON HCL 4 MG/2ML IJ SOLN: 4.0000 mg | Freq: Four times a day (QID) | INTRAMUSCULAR | Status: DC | PRN

## 2018-09-06 MED ORDER — LIDOCAINE HCL (CARDIAC) PF 100 MG/5ML IV SOSY
PREFILLED_SYRINGE | INTRAVENOUS | Status: DC | PRN
  Administered 2018-09-06: 100 mg via INTRAVENOUS

## 2018-09-06 MED ORDER — CELECOXIB 200 MG PO CAPS
200.0000 mg | ORAL_CAPSULE | Freq: Two times a day (BID) | ORAL | Status: DC
  Administered 2018-09-06 – 2018-09-08 (×4): 200 mg via ORAL
  Filled 2018-09-06 (×4): qty 1

## 2018-09-06 MED ORDER — CELECOXIB 200 MG PO CAPS
400.0000 mg | ORAL_CAPSULE | Freq: Once | ORAL | Status: AC
  Administered 2018-09-06: 10:00:00 400 mg via ORAL

## 2018-09-06 MED ORDER — MIDAZOLAM HCL 5 MG/5ML IJ SOLN
INTRAMUSCULAR | Status: DC | PRN
  Administered 2018-09-06: 1 mg via INTRAVENOUS

## 2018-09-06 MED ORDER — ACETAMINOPHEN 325 MG PO TABS: 325.0000 mg | ORAL_TABLET | Freq: Four times a day (QID) | ORAL | Status: DC | PRN

## 2018-09-06 MED ORDER — FLEET ENEMA 7-19 GM/118ML RE ENEM: 1.0000 | ENEMA | Freq: Once | RECTAL | Status: DC | PRN

## 2018-09-06 MED ORDER — OXYCODONE HCL 5 MG PO TABS
10.0000 mg | ORAL_TABLET | ORAL | Status: DC | PRN
  Administered 2018-09-08: 10 mg via ORAL
  Filled 2018-09-06: qty 2

## 2018-09-06 MED ORDER — ACETAMINOPHEN 10 MG/ML IV SOLN
INTRAVENOUS | Status: AC
  Filled 2018-09-06: qty 100

## 2018-09-06 MED ORDER — METOCLOPRAMIDE HCL 10 MG PO TABS: 5.0000 mg | ORAL_TABLET | Freq: Three times a day (TID) | ORAL | Status: DC | PRN

## 2018-09-06 MED ORDER — PHENOL 1.4 % MT LIQD
1.0000 | OROMUCOSAL | Status: DC | PRN
  Filled 2018-09-06: qty 177

## 2018-09-06 MED ORDER — METOCLOPRAMIDE HCL 5 MG/ML IJ SOLN: 5.0000 mg | Freq: Three times a day (TID) | INTRAMUSCULAR | Status: DC | PRN

## 2018-09-06 MED ORDER — CHLORHEXIDINE GLUCONATE 4 % EX LIQD
60.0000 mL | Freq: Once | CUTANEOUS | Status: AC
  Administered 2018-09-06: 4 via TOPICAL

## 2018-09-06 MED ORDER — FERROUS SULFATE 325 (65 FE) MG PO TABS
325.0000 mg | ORAL_TABLET | Freq: Two times a day (BID) | ORAL | Status: DC
  Administered 2018-09-06 – 2018-09-08 (×4): 325 mg via ORAL
  Filled 2018-09-06 (×4): qty 1

## 2018-09-06 MED ORDER — MENTHOL 3 MG MT LOZG
1.0000 | LOZENGE | OROMUCOSAL | Status: DC | PRN
  Filled 2018-09-06: qty 9

## 2018-09-06 MED ORDER — PROPOFOL 500 MG/50ML IV EMUL
INTRAVENOUS | Status: DC | PRN
  Administered 2018-09-06: 50 ug/kg/min via INTRAVENOUS

## 2018-09-06 MED ORDER — PANTOPRAZOLE SODIUM 40 MG PO TBEC
40.0000 mg | DELAYED_RELEASE_TABLET | Freq: Two times a day (BID) | ORAL | Status: DC
  Administered 2018-09-06 – 2018-09-08 (×4): 40 mg via ORAL
  Filled 2018-09-06 (×5): qty 1

## 2018-09-06 MED ORDER — PROPOFOL 500 MG/50ML IV EMUL
INTRAVENOUS | Status: AC
  Filled 2018-09-06: qty 50

## 2018-09-06 MED ORDER — FENTANYL CITRATE (PF) 100 MCG/2ML IJ SOLN: 25.0000 ug | INTRAMUSCULAR | Status: DC | PRN

## 2018-09-06 MED ORDER — PHENYLEPHRINE HCL (PRESSORS) 10 MG/ML IV SOLN
INTRAVENOUS | Status: AC
  Filled 2018-09-06: qty 1

## 2018-09-06 MED ORDER — PROPOFOL 10 MG/ML IV BOLUS
INTRAVENOUS | Status: DC | PRN
  Administered 2018-09-06 (×2): 19 mg via INTRAVENOUS

## 2018-09-06 MED ORDER — ACETAMINOPHEN 10 MG/ML IV SOLN
INTRAVENOUS | Status: DC | PRN
  Administered 2018-09-06: 1000 mg via INTRAVENOUS

## 2018-09-06 MED ORDER — ONDANSETRON HCL 4 MG PO TABS: 4.0000 mg | ORAL_TABLET | Freq: Four times a day (QID) | ORAL | Status: DC | PRN

## 2018-09-06 SURGICAL SUPPLY — 70 items
ATTUNE MED DOME PAT 38 KNEE (Knees) ×1 IMPLANT
ATTUNE MED DOME PAT 38MM KNEE (Knees) ×1 IMPLANT
ATTUNE PS FEM RT SZ 5 CEM KNEE (Femur) ×2 IMPLANT
ATTUNE PSRP INSE SZ 5 7MM KNEE (Insert) ×1 IMPLANT
ATTUNE PSRP INSE SZ5 7 KNEE (Insert) ×1 IMPLANT
BASE TIBIA ATTUNE KNEE SYS SZ6 (Knees) IMPLANT
BATTERY INSTRU NAVIGATION (MISCELLANEOUS) ×12 IMPLANT
BLADE SAW 70X12.5 (BLADE) ×3 IMPLANT
BLADE SAW 90X13X1.19 OSCILLAT (BLADE) ×3 IMPLANT
BLADE SAW 90X25X1.19 OSCILLAT (BLADE) ×3 IMPLANT
CANISTER SUCT 3000ML PPV (MISCELLANEOUS) ×3 IMPLANT
CEMENT HV SMART SET (Cement) ×6 IMPLANT
COOLER POLAR GLACIER W/PUMP (MISCELLANEOUS) ×3 IMPLANT
COVER WAND RF STERILE (DRAPES) ×3 IMPLANT
CUFF TOURN SGL QUICK 24 (TOURNIQUET CUFF) ×2
CUFF TOURN SGL QUICK 30 (TOURNIQUET CUFF)
CUFF TRNQT CYL 24X4X16.5-23 (TOURNIQUET CUFF) IMPLANT
CUFF TRNQT CYL 30X4X21-28X (TOURNIQUET CUFF) IMPLANT
DRAPE 3/4 80X56 (DRAPES) ×3 IMPLANT
DRSG DERMACEA 8X12 NADH (GAUZE/BANDAGES/DRESSINGS) ×3 IMPLANT
DRSG OPSITE POSTOP 4X14 (GAUZE/BANDAGES/DRESSINGS) ×3 IMPLANT
DRSG TEGADERM 4X4.75 (GAUZE/BANDAGES/DRESSINGS) ×3 IMPLANT
DURAPREP 26ML APPLICATOR (WOUND CARE) ×6 IMPLANT
ELECT REM PT RETURN 9FT ADLT (ELECTROSURGICAL) ×3
ELECTRODE REM PT RTRN 9FT ADLT (ELECTROSURGICAL) ×1 IMPLANT
EX-PIN ORTHOLOCK NAV 4X150 (PIN) ×6 IMPLANT
GLOVE BIOGEL M STRL SZ7.5 (GLOVE) ×6 IMPLANT
GLOVE INDICATOR 8.0 STRL GRN (GLOVE) ×3 IMPLANT
GOWN STRL REUS W/ TWL LRG LVL3 (GOWN DISPOSABLE) ×2 IMPLANT
GOWN STRL REUS W/TWL LRG LVL3 (GOWN DISPOSABLE) ×4
HEMOVAC 400CC 10FR (MISCELLANEOUS) ×3 IMPLANT
HOLDER FOLEY CATH W/STRAP (MISCELLANEOUS) ×3 IMPLANT
HOOD PEEL AWAY FLYTE STAYCOOL (MISCELLANEOUS) ×6 IMPLANT
KIT TURNOVER KIT A (KITS) ×3 IMPLANT
KNIFE SCULPS 14X20 (INSTRUMENTS) ×3 IMPLANT
LABEL OR SOLS (LABEL) ×3 IMPLANT
MANIFOLD NEPTUNE II (INSTRUMENTS) ×3 IMPLANT
NDL SAFETY ECLIPSE 18X1.5 (NEEDLE) ×1 IMPLANT
NDL SPNL 20GX3.5 QUINCKE YW (NEEDLE) ×2 IMPLANT
NEEDLE HYPO 18GX1.5 SHARP (NEEDLE) ×2
NEEDLE SPNL 20GX3.5 QUINCKE YW (NEEDLE) ×6 IMPLANT
NS IRRIG 500ML POUR BTL (IV SOLUTION) ×3 IMPLANT
PACK TOTAL KNEE (MISCELLANEOUS) ×3 IMPLANT
PAD WRAPON POLAR KNEE (MISCELLANEOUS) ×1 IMPLANT
PENCIL SMOKE ULTRAEVAC 22 CON (MISCELLANEOUS) ×3 IMPLANT
PIN DRILL QUICK PACK ×3 IMPLANT
PIN FIXATION 1/8DIA X 3INL (PIN) ×9 IMPLANT
PULSAVAC PLUS IRRIG FAN TIP (DISPOSABLE) ×3
SLEEVE PROTECTION STRL DISP (MISCELLANEOUS) ×2 IMPLANT
SOL .9 NS 3000ML IRR  AL (IV SOLUTION) ×2
SOL .9 NS 3000ML IRR UROMATIC (IV SOLUTION) ×1 IMPLANT
SOL PREP PVP 2OZ (MISCELLANEOUS) ×3
SOLUTION PREP PVP 2OZ (MISCELLANEOUS) ×1 IMPLANT
SPONGE DRAIN TRACH 4X4 STRL 2S (GAUZE/BANDAGES/DRESSINGS) ×3 IMPLANT
STAPLER SKIN PROX 35W (STAPLE) ×3 IMPLANT
STOCKINETTE IMPERV 14X48 (MISCELLANEOUS) IMPLANT
STRAP TIBIA SHORT (MISCELLANEOUS) ×3 IMPLANT
SUCTION FRAZIER HANDLE 10FR (MISCELLANEOUS) ×2
SUCTION TUBE FRAZIER 10FR DISP (MISCELLANEOUS) ×1 IMPLANT
SUT VIC AB 0 CT1 36 (SUTURE) ×5 IMPLANT
SUT VIC AB 1 CT1 36 (SUTURE) ×6 IMPLANT
SUT VIC AB 2-0 CT2 27 (SUTURE) ×3 IMPLANT
SYR 20ML LL LF (SYRINGE) ×3 IMPLANT
SYR 30ML LL (SYRINGE) ×6 IMPLANT
TIBIA ATTUNE KNEE SYS BASE SZ6 (Knees) ×3 IMPLANT
TIP FAN IRRIG PULSAVAC PLUS (DISPOSABLE) ×1 IMPLANT
TOWEL OR 17X26 4PK STRL BLUE (TOWEL DISPOSABLE) ×3 IMPLANT
TOWER CARTRIDGE SMART MIX (DISPOSABLE) ×3 IMPLANT
TRAY FOLEY MTR SLVR 16FR STAT (SET/KITS/TRAYS/PACK) ×3 IMPLANT
WRAPON POLAR PAD KNEE (MISCELLANEOUS) ×3

## 2018-09-06 NOTE — Anesthesia Preprocedure Evaluation (Signed)
Anesthesia Evaluation  Patient identified by MRN, date of birth, ID band Patient awake    Reviewed: Allergy & Precautions, NPO status , Patient's Chart, lab work & pertinent test results  History of Anesthesia Complications Negative for: history of anesthetic complications  Airway Mallampati: II  TM Distance: >3 FB Neck ROM: Full    Dental no notable dental hx.    Pulmonary neg pulmonary ROS, neg sleep apnea, neg COPD,    breath sounds clear to auscultation- rhonchi (-) wheezing      Cardiovascular hypertension, Pt. on medications (-) CAD, (-) Past MI, (-) Cardiac Stents and (-) CABG  Rhythm:Regular Rate:Normal - Systolic murmurs and - Diastolic murmurs    Neuro/Psych neg Seizures Anxiety negative neurological ROS     GI/Hepatic Neg liver ROS, GERD  ,  Endo/Other  diabetes  Renal/GU negative Renal ROS     Musculoskeletal   Abdominal (+) - obese,   Peds  Hematology   Anesthesia Other Findings Past Medical History: No date: Anxiety No date: Arthritis No date: Atherosclerosis No date: Cholelithiasis with choledocholithiasis No date: Diabetes mellitus without complication (HCC) No date: Diverticulosis No date: Elevated lipids No date: GERD (gastroesophageal reflux disease) No date: Hyperlipemia No date: Hypertension No date: Liver lesion No date: Sepsis ([place])   Reproductive/Obstetrics                             Lab Results  Component Value Date   WBC 6.4 08/30/2018   HGB 14.3 08/30/2018   HCT 42.6 08/30/2018   MCV 96.6 08/30/2018   PLT 224 08/30/2018    Anesthesia Physical Anesthesia Plan  ASA: III  Anesthesia Plan: Spinal   Post-op Pain Management:    Induction:   PONV Risk Score and Plan: 1 and Ondansetron and Propofol infusion  Airway Management Planned: Natural Airway  Additional Equipment:   Intra-op Plan:   Post-operative Plan:   Informed Consent: I have  reviewed the patients History and Physical, chart, labs and discussed the procedure including the risks, benefits and alternatives for the proposed anesthesia with the patient or authorized representative who has indicated his/her understanding and acceptance.     Dental advisory given  Plan Discussed with: CRNA and Anesthesiologist  Anesthesia Plan Comments:         Anesthesia Quick Evaluation

## 2018-09-06 NOTE — Op Note (Addendum)
OPERATIVE NOTE  DATE OF SURGERY:  09/06/2018  PATIENT NAME:  Jesus White   DOB: 02/10/1945  MRN: [id number]  PRE-OPERATIVE DIAGNOSIS: Degenerative arthrosis of the right knee, primary  POST-OPERATIVE DIAGNOSIS:  Same  PROCEDURE:  Right total knee arthroplasty using computer-assisted navigation  SURGEON:  Marciano Sequin. M.D.  ANESTHESIA: spinal  ESTIMATED BLOOD LOSS: 50 mL  FLUIDS REPLACED: 1300 mL of crystalloid  TOURNIQUET TIME: 95 minutes  DRAINS: 2 medium Hemovac drains  SOFT TISSUE RELEASES: Anterior cruciate ligament, posterior cruciate ligament, deep  medial collateral ligament, patellofemoral ligament  IMPLANTS UTILIZED: DePuy Attune size 5 posterior stabilized femoral component (cemented), size 6 rotating platform tibial component (cemented), 38 mm medialized dome patella (cemented), and a 7 mm stabilized rotating platform polyethylene insert.  INDICATIONS FOR SURGERY: Jesus White is a 73 y.o. year old male with a long history of progressive knee pain. X-rays demonstrated severe degenerative changes in tricompartmental fashion. The patient had not seen any significant improvement despite conservative nonsurgical intervention. After discussion of the risks and benefits of surgical intervention, the patient expressed understanding of the risks benefits and agree with plans for total knee arthroplasty.   The risks, benefits, and alternatives were discussed at length including but not limited to the risks of infection, bleeding, nerve injury, stiffness, blood clots, the need for revision surgery, cardiopulmonary complications, among others, and they were willing to proceed.  PROCEDURE IN DETAIL: The patient was brought into the operating room and, after adequate spinal anesthesia was achieved, a tourniquet was placed on the patient's upper thigh. The patient's knee and leg were cleaned and prepped with alcohol and DuraPrep and draped in the usual sterile fashion. A  "timeout" was performed as per usual protocol. The lower extremity was exsanguinated using an Esmarch, and the tourniquet was inflated to 300 mmHg. An anterior longitudinal incision was made followed by a standard mid vastus approach. The deep fibers of the medial collateral ligament were elevated in a subperiosteal fashion off of the medial flare of the tibia so as to maintain a continuous soft tissue sleeve. The patella was subluxed laterally and the patellofemoral ligament was incised. Inspection of the knee demonstrated severe degenerative changes with full-thickness loss of articular cartilage.  An exostosis was noted emanating from the medial metaphyseal region of the distal femur.  An osteotome was used to excise the exostosis at its base.  The specimen was submitted to pathology.  Osteophytes were debrided using a rongeur. Anterior and posterior cruciate ligaments were excised. Two 4.0 mm Schanz pins were inserted in the femur and into the tibia for attachment of the array of trackers used for computer-assisted navigation. Hip center was identified using a circumduction technique. Distal landmarks were mapped using the computer. The distal femur and proximal tibia were mapped using the computer. The distal femoral cutting guide was positioned using computer-assisted navigation so as to achieve a 5 distal valgus cut. The femur was sized and it was felt that a size 5 femoral component was appropriate. A size 5 femoral cutting guide was positioned and the anterior cut was performed and verified using the computer. This was followed by completion of the posterior and chamfer cuts. Femoral cutting guide for the central box was then positioned in the center box cut was performed.  Attention was then directed to the proximal tibia. Medial and lateral menisci were excised. The extramedullary tibial cutting guide was positioned using computer-assisted navigation so as to achieve a 0 varus-valgus alignment and 3  posterior slope. The cut was performed and verified using the computer. The proximal tibia was sized and it was felt that a size 6 tibial tray was appropriate. Tibial and femoral trials were inserted followed by insertion of a 7 mm polyethylene insert. This allowed for excellent mediolateral soft tissue balancing both in flexion and in full extension. Finally, the patella was cut and prepared so as to accommodate a 38 mm medialized dome patella. A patella trial was placed and the knee was placed through a range of motion with excellent patellar tracking appreciated. The femoral trial was removed after debridement of posterior osteophytes. The central post-hole for the tibial component was reamed followed by insertion of a keel punch. Tibial trials were then removed. Cut surfaces of bone were irrigated with copious amounts of normal saline with antibiotic solution using pulsatile lavage and then suctioned dry. Polymethylmethacrylate cement was prepared in the usual fashion using a vacuum mixer. Cement was applied to the cut surface of the proximal tibia as well as along the undersurface of a size 6 rotating platform tibial component. Tibial component was positioned and impacted into place. Excess cement was removed using Personal assistantreer elevators. Cement was then applied to the cut surfaces of the femur as well as along the posterior flanges of the size 5 femoral component. The femoral component was positioned and impacted into place. Excess cement was removed using Personal assistantreer elevators. A 7 mm polyethylene trial was inserted and the knee was brought into full extension with steady axial compression applied. Finally, cement was applied to the backside of a 38 mm medialized dome patella and the patellar component was positioned and patellar clamp applied. Excess cement was removed using Personal assistantreer elevators. After adequate curing of the cement, the tourniquet was deflated after a total tourniquet time of 95 minutes. Hemostasis was  achieved using electrocautery. The knee was irrigated with copious amounts of normal saline with antibiotic solution using pulsatile lavage and then suctioned dry. 20 mL of 1.3% Exparel and 60 mL of 0.25% Marcaine in 40 mL of normal saline was injected along the posterior capsule, medial and lateral gutters, and along the arthrotomy site. A 7 mm stabilized rotating platform polyethylene insert was inserted and the knee was placed through a range of motion with excellent mediolateral soft tissue balancing appreciated and excellent patellar tracking noted. 2 medium drains were placed in the wound bed and brought out through separate stab incisions. The medial parapatellar portion of the incision was reapproximated using interrupted sutures of #1 Vicryl. Subcutaneous tissue was approximated in layers using first #0 Vicryl followed #2-0 Vicryl. The skin was approximated with skin staples. A sterile dressing was applied.  The patient tolerated the procedure well and was transported to the recovery room in stable condition.    James P. Angie FavaHooten, Jr., M.D.

## 2018-09-06 NOTE — Anesthesia Post-op Follow-up Note (Signed)
Anesthesia QCDR form completed.        

## 2018-09-06 NOTE — Transfer of Care (Signed)
Immediate Anesthesia Transfer of Care Note  Patient: Jesus White  Procedure(s) Performed: COMPUTER ASSISTED TOTAL KNEE ARTHROPLASTY - RNFA (Right Knee)  Patient Location: PACU  Anesthesia Type:Spinal  Level of Consciousness: awake, alert , oriented and patient cooperative  Airway & Oxygen Therapy: Patient Spontanous Breathing  Post-op Assessment: Report given to RN and Post -op Vital signs reviewed and stable  Post vital signs: Reviewed and stable  Last Vitals:  Vitals Value Taken Time  BP 99/66 09/06/18 1457  Temp 36.9 C 09/06/18 1457  Pulse 73 09/06/18 1458  Resp 13 09/06/18 1458  SpO2 100 % 09/06/18 1458    Last Pain:  Vitals:   09/06/18 1008  TempSrc: Temporal  PainSc: 7          Complications: No apparent anesthesia complications

## 2018-09-06 NOTE — H&P (Signed)
The patient has been re-examined, and the chart reviewed, and there have been no interval changes to the documented history and physical.    The risks, benefits, and alternatives have been discussed at length. The patient expressed understanding of the risks benefits and agreed with plans for surgical intervention.  Jager Koska P. Braxen Dobek, Jr. M.D.    

## 2018-09-06 NOTE — Anesthesia Procedure Notes (Signed)
Spinal  Patient location during procedure: OR Start time: 09/06/2018 11:23 AM End time: 09/06/2018 11:26 AM Staffing Anesthesiologist: Emmie Niemann, MD Resident/CRNA: Eben Burow, CRNA Performed: resident/CRNA  Preanesthetic Checklist Completed: patient identified, site marked, surgical consent, pre-op evaluation, timeout performed, IV checked, risks and benefits discussed and monitors and equipment checked Spinal Block Patient position: sitting Prep: ChloraPrep and site prepped and draped Patient monitoring: heart rate, continuous pulse ox and blood pressure Approach: midline Location: L3-4 Injection technique: single-shot Needle Needle type: Pencan and Introducer  Needle gauge: 24 G Needle length: 10 cm Assessment Sensory level: T6

## 2018-09-07 MED ORDER — TRAMADOL HCL 50 MG PO TABS: 50.0000 mg | ORAL_TABLET | Freq: Four times a day (QID) | ORAL | 0 refills | Status: DC | PRN

## 2018-09-07 MED ORDER — ENOXAPARIN SODIUM 40 MG/0.4ML ~~LOC~~ SOLN: 40.0000 mg | SUBCUTANEOUS | 0 refills | Status: DC

## 2018-09-07 MED ORDER — OXYCODONE HCL 5 MG PO TABS: 5.0000 mg | ORAL_TABLET | ORAL | 0 refills | Status: DC | PRN

## 2018-09-07 NOTE — TOC Progression Note (Signed)
Transition of Care [facility]) - Progression Note    Patient Details  Name: Jesus White MRN: [id number] Date of Birth: [birth year]  Transition of Care [facility]) CM/SW [place], RN Phone Number: 09/07/2018, 11:30 AM  Clinical Narrative:    Requested the price of Lovenox will notify the patient once onbtained        Expected Discharge Plan and Services                                                 Social Determinants of Health (SDOH) Interventions    Readmission Risk Interventions No flowsheet data found.

## 2018-09-07 NOTE — Evaluation (Signed)
Occupational Therapy Evaluation Patient Details Name: Jesus White MRN: [id number] DOB: [birth year] Today's Date: 09/07/2018    History of Present Illness 73yo male s/p R TKA on 09/06/18.   Clinical Impression   Pt seen for OT evaluation this date, POD#1 from above surgery. Pt was independent in all ADLs prior to surgery, however occasionally using a 2WW for functional mobility due to R knee pain. Pt is eager to return to PLOF with less pain and improved safety and independence. Pt currently requires minimal assist for LB dressing while in seated position due to pain and limited AROM of R knee and supervision to CGA for functional ADL transfers. Pt instructed in polar care mgt, falls prevention strategies, home/routines modifications, DME/AE for LB bathing and dressing tasks, and compression stocking mgt. Handout provided; pt verbalized understanding and denied additional skilled OT needs. Pt has wife at home who, per pt, is able to provide needed level of assist. Do not currently anticipate any additional skilled OT needs during and following this hospitalization. Will sign off. Please re-consult if additional needs arise.     Follow Up Recommendations  No OT follow up    Equipment Recommendations  None recommended by OT    Recommendations for Other Services       Precautions / Restrictions Precautions Precautions: Knee Precaution Booklet Issued: No Precaution Comments: indep w/ SLR, no KI needed Restrictions Weight Bearing Restrictions: Yes RLE Weight Bearing: Weight bearing as tolerated      Mobility Bed Mobility               General bed mobility comments: deferred, pt received in recliner  Transfers Overall transfer level: Needs assistance Equipment used: Rolling walker (2 wheeled) Transfers: Sit to/from Stand Sit to Stand: Min guard              Balance Overall balance assessment: Needs assistance Sitting-balance support: Feet supported Sitting  balance-Leahy Scale: Normal     Standing balance support: Bilateral upper extremity supported Standing balance-Leahy Scale: Good                             ADL either performed or assessed with clinical judgement   ADL Overall ADL's : Needs assistance/impaired                                       General ADL Comments: Min A for LB dressing and bathing, Min-Mod A for compression stocking mgt - wife able to provide     Vision Patient Visual Report: No change from baseline Vision Assessment?: No apparent visual deficits     Perception     Praxis      Pertinent Vitals/Pain Pain Assessment: 0-10 Pain Score: 5  Pain Location: R knee Pain Descriptors / Indicators: Aching Pain Intervention(s): Limited activity within patient's tolerance;Monitored during session;Repositioned;Ice applied     Hand Dominance Right   Extremity/Trunk Assessment Upper Extremity Assessment Upper Extremity Assessment: Overall WFL for tasks assessed   Lower Extremity Assessment Lower Extremity Assessment: Overall WFL for tasks assessed(expected post-op strength/ROM deficits, but not functionally limiting pt much)   Cervical / Trunk Assessment Cervical / Trunk Assessment: Normal   Communication Communication Communication: No difficulties   Cognition Arousal/Alertness: Awake/alert Behavior During Therapy: WFL for tasks assessed/performed Overall Cognitive Status: Within Functional Limits for tasks assessed  General Comments       Exercises Other Exercises Other Exercises: Pt instructed in AE/DME, home/routines modifications, falls prevention including pet care considerations, compression stocking mgt, and polar care mgt; handout provided to support recall and carryover   Shoulder Instructions      Home Living Family/patient expects to be discharged to:: Private residence Living Arrangements: Spouse/significant  other Available Help at Discharge: Family;Available 24 hours/day Type of Home: House Home Access: Stairs to enter Entergy CorporationEntrance Stairs-Number of Steps: 2 from garage with no rails (primary); 5-6 front with rail and wall   Home Layout: One level     Bathroom Shower/Tub: Producer, television/film/videoWalk-in shower   Bathroom Toilet: Handicapped height     Home Equipment: Shower seat - built in;Walker - 2 wheels;Toilet riser          Prior Functioning/Environment Level of Independence: Independent with assistive device(s)        Comments: PRN use of RW at home 2/2 knee pain; indep with ADL, driving, no falls        OT Problem List: Decreased strength;Decreased range of motion;Pain      OT Treatment/Interventions:      OT Goals(Current goals can be found in the care plan section) Acute Rehab OT Goals Patient Stated Goal: go home and return to PLOF with less knee pain OT Goal Formulation: All assessment and education complete, DC therapy  OT Frequency:     Barriers to D/C:            Co-evaluation              AM-PAC OT "6 Clicks" Daily Activity     Outcome Measure Help from another person eating meals?: None Help from another person taking care of personal grooming?: None Help from another person toileting, which includes using toliet, bedpan, or urinal?: A Little Help from another person bathing (including washing, rinsing, drying)?: A Little Help from another person to put on and taking off regular upper body clothing?: None Help from another person to put on and taking off regular lower body clothing?: A Little 6 Click Score: 21   End of Session    Activity Tolerance: Patient tolerated treatment well Patient left: in chair;with call bell/phone within reach;with chair alarm set;with SCD's reapplied;Other (comment)(polar care in place)  OT Visit Diagnosis: Other abnormalities of gait and mobility (R26.89);Pain Pain - Right/Left: Right Pain - part of body: Knee                Time:  [id number] OT Time Calculation (min): 17 min Charges:  OT General Charges $OT Visit: 1 Visit OT Evaluation $OT Eval Low Complexity: 1 Low OT Treatments $Self Care/Home Management : 8-22 mins  Richrd PrimeJamie Stiller, MPH, MS, OTR/L ascom [phone] 09/07/18, 10:30 AM

## 2018-09-07 NOTE — Progress Notes (Signed)
   Subjective: 1 Day Post-Op Procedure(s) (LRB): COMPUTER ASSISTED TOTAL KNEE ARTHROPLASTY - RNFA (Right) Patient reports pain as 1 on 0-10 scale.  And this is mostly from the foot being in the bone foam Patient is well, and has had no acute complaints or problems We will start therapy today.  Plan is to go Home after hospital stay. no nausea and no vomiting Patient denies any chest pains or shortness of breath. Patient resting well and voicing no complaints  Objective: Vital signs in last 24 hours: Temp:  [97.1 F (36.2 C)-98.5 F (36.9 C)] 97.4 F (36.3 C) (08/18 0354) Pulse Rate:  [63-95] 63 (08/18 0354) Resp:  [13-18] 18 (08/18 0354) BP: (99-154)/(64-84) 116/71 (08/18 0354) SpO2:  [95 %-100 %] 95 % (08/18 0354) Weight:  [77.1 kg] 77.1 kg (08/17 1008) Heels are non tender and elevated off the bed using rolled towels along with bone foam under operative heel  Intake/Output from previous day: 08/17 0701 - 08/18 0700 In: 2206.7 [P.O.:50; I.V.:1556.7; IV Piggyback:600] Out: 1590 [Urine:1450; Drains:90; Blood:50] Intake/Output this shift: No intake/output data recorded.  No results for input(s): HGB in the last 72 hours. No results for input(s): WBC, RBC, HCT, PLT in the last 72 hours. No results for input(s): NA, K, CL, CO2, BUN, CREATININE, GLUCOSE, CALCIUM in the last 72 hours. No results for input(s): LABPT, INR in the last 72 hours.  EXAM General - Patient is Alert, Appropriate and Oriented Extremity - Neurologically intact Neurovascular intact Sensation intact distally Intact pulses distally Dorsiflexion/Plantar flexion intact Compartment soft Dressing - dressing C/D/I Motor Function - intact, moving foot and toes well on exam.  Able to do straight leg raise on his own as well as flex the knee  Past Medical History:  Diagnosis Date  . Anxiety   . Arthritis   . Atherosclerosis   . Cholelithiasis with choledocholithiasis   . Diabetes mellitus without  complication ([place])   . Diverticulosis   . Elevated lipids   . GERD (gastroesophageal reflux disease)   . Hyperlipemia   . Hypertension   . Liver lesion   . Sepsis ([place])     Assessment/Plan: 1 Day Post-Op Procedure(s) (LRB): COMPUTER ASSISTED TOTAL KNEE ARTHROPLASTY - RNFA (Right) Active Problems:   Total knee replacement status  Estimated body mass index is 23.71 kg/m as calculated from the following:   Height as of this encounter: 5\' 11"  (1.803 m).   Weight as of this encounter: 77.1 kg. Advance diet Up with therapy D/C IV fluids Plan for discharge tomorrow Discharge home with home health  Labs: None DVT Prophylaxis - Lovenox, Foot Pumps and TED hose Weight-Bearing as tolerated to right leg D/C O2 and Pulse OX and try on Room Air Begin working on bowel movement  Jesus White R. [place] 09/07/2018, 7:17 AM

## 2018-09-07 NOTE — Anesthesia Postprocedure Evaluation (Signed)
Anesthesia Post Note  Patient: Jesus White  Procedure(s) Performed: COMPUTER ASSISTED TOTAL KNEE ARTHROPLASTY - RNFA (Right Knee)  Patient location during evaluation: Nursing Unit Anesthesia Type: Spinal Level of consciousness: oriented and awake and alert Pain management: pain level controlled Vital Signs Assessment: post-procedure vital signs reviewed and stable Respiratory status: spontaneous breathing and respiratory function stable Cardiovascular status: blood pressure returned to baseline and stable Postop Assessment: no headache, no backache, no apparent nausea or vomiting and patient able to bend at knees Anesthetic complications: no     Last Vitals:  Vitals:   09/07/18 0354 09/07/18 0738  BP: 116/71 (!) 147/79  Pulse: 63 69  Resp: 18   Temp: (!) 36.3 C 36.7 C  SpO2: 95% 99%    Last Pain:  Vitals:   09/07/18 0738  TempSrc: Oral  PainSc:                  Brantley Fling

## 2018-09-07 NOTE — TOC Benefit Eligibility Note (Signed)
Transition of Care [facility]) Benefit Eligibility Note    Patient Details  Name: Jesus White MRN: [id number] Date of Birth: 12/24/1945   Medication/Dose: Enoxaparin 40mg  once daily for 14 days  Covered?: Yes  Prescription Coverage [facility] noted [facility].  Rep unable to determine estimated copay due to medication not on file for patient.  Spoke with Person/Company/Phone Number:: Danise Mina with [employer] [phone]  Co-Pay: $64.00 estimated copay for mail order.  Unable to provide retail pricing.  Prior Approval: Yes(Rep strongly encouraged [place] - [phone])  Deductible: (Rep unable to provide.)   Dannette Barbara Phone Number: [phone] or [phone] 09/07/2018, 1:32 PM

## 2018-09-07 NOTE — Progress Notes (Signed)
Physical Therapy Treatment Patient Details Name: PASCO MARCHITTO MRN: [id number] DOB: [birth year] Today's Date: 09/07/2018    History of Present Illness 73yo male s/p R TKA on 09/06/18.    PT Comments    Pt continues to do very well with all aspects of POD1 PT.  He again easily circumambulated the nurses' station with good speed, consistent cadence, and no subjective fatigue.  He was also able to negotiate up/down steps w/o rails showing good understanding and confidence.  Pt with good strength and ROM 0~100 w/o c/o excessive pain.  Pt safe to go home from PT perspective, progressing very nicely.   Follow Up Recommendations  Home health PT     Equipment Recommendations  None recommended by PT    Recommendations for Other Services       Precautions / Restrictions Precautions Precautions: Knee Restrictions Weight Bearing Restrictions: Yes RLE Weight Bearing: Weight bearing as tolerated    Mobility  Bed Mobility Overal bed mobility: Independent             General bed mobility comments: Easily gets back into bed w/o assist  Transfers Overall transfer level: Needs assistance Equipment used: Rolling walker (2 wheeled) Transfers: Sit to/from Stand Sit to Stand: Min guard            Ambulation/Gait Ambulation/Gait assistance: Modified independent (Device/Increase time) Gait Distance (Feet): 250 Feet Assistive device: Rolling walker (2 wheeled)       General Gait Details: Pt continues to show very good confidence and safety with ambulation and overall had no issues or excessive fatigue    Stairs Stairs: Yes Stairs assistance: Modified independent (Device/Increase time) Stair Management: No rails;Backwards;With walker Number of Stairs: 4 General stair comments: Pt was able to negotiate up/down steps using backward strategy, after minimal cuing pt able to manage w/o assist or further cuing   Wheelchair Mobility    Modified Rankin (Stroke Patients Only)        Balance Overall balance assessment: Modified Independent Sitting-balance support: Feet supported Sitting balance-Leahy Scale: Normal     Standing balance support: Bilateral upper extremity supported Standing balance-Leahy Scale: Good                              Cognition Arousal/Alertness: Awake/alert Behavior During Therapy: WFL for tasks assessed/performed Overall Cognitive Status: Within Functional Limits for tasks assessed                                        Exercises Total Joint Exercises Ankle Circles/Pumps: AROM;10 reps Quad Sets: Strengthening;15 reps Short Arc Quad: Strengthening;15 reps Heel Slides: Strengthening;10 reps Hip ABduction/ADduction: Strengthening;15 reps Straight Leg Raises: Strengthening;15 reps Knee Flexion: PROM;5 reps Goniometric ROM: 0-92 Other Exercises Other Exercises: Pt instructed in AE/DME, home/routines modifications, falls prevention including pet care considerations, compression stocking mgt, and polar care mgt; handout provided to support recall and carryover    General Comments        Pertinent Vitals/Pain Pain Assessment: 0-10 Pain Score: 4  Pain Location: R knee(increases with ROM acts)    Home Living Family/patient expects to be discharged to:: Private residence Living Arrangements: Spouse/significant other Available Help at Discharge: Family;Available 24 hours/day Type of Home: House Home Access: Stairs to enter   Home Layout: One level Home Equipment: Shower seat - built in;Walker - 2 wheels;Toilet riser  Prior Function Level of Independence: Independent      Comments: PRN use of RW at home 2/2 knee pain; indep with ADL, driving, no falls   PT Goals (current goals can now be found in the care plan section) Acute Rehab PT Goals Patient Stated Goal: go home and return to PLOF with less knee pain PT Goal Formulation: With patient Time For Goal Achievement: 09/21/18 Potential  to Achieve Goals: Good Progress towards PT goals: Progressing toward goals    Frequency    BID      PT Plan Current plan remains appropriate    Co-evaluation              AM-PAC PT "6 Clicks" Mobility   Outcome Measure  Help needed turning from your back to your side while in a flat bed without using bedrails?: None Help needed moving from lying on your back to sitting on the side of a flat bed without using bedrails?: None Help needed moving to and from a bed to a chair (including a wheelchair)?: None Help needed standing up from a chair using your arms (e.g., wheelchair or bedside chair)?: None Help needed to walk in hospital room?: None Help needed climbing 3-5 steps with a railing? : None 6 Click Score: 24    End of Session Equipment Utilized During Treatment: Gait belt Activity Tolerance: Patient tolerated treatment well Patient left: with call bell/phone within reach;with chair alarm set Nurse Communication: Mobility status PT Visit Diagnosis: Muscle weakness (generalized) (M62.81);Difficulty in walking, not elsewhere classified (R26.2);Pain Pain - Right/Left: Right Pain - part of body: Knee     Time: [id number] PT Time Calculation (min) (ACUTE ONLY): 28 min  Charges:  $Gait Training: 8-22 mins $Therapeutic Exercise: 8-22 mins                     Malachi ProGalen R Peirce Deveney, DPT 09/07/2018, 2:24 PM

## 2018-09-07 NOTE — Discharge Summary (Signed)
Physician Discharge Summary  Patient ID: Jesus White MRN: [id number] DOB/AGE: 1945/08/03 73 y.o.  Admit date: 09/06/2018 Discharge date: 09/08/2018  Admission Diagnoses:  PRIMARY OSTEOARTHRITIS OF RIGHT KNEE   Discharge Diagnoses: Patient Active Problem List   Diagnosis Date Noted  . Anxiety 09/06/2018  . GERD without esophagitis 09/06/2018  . Pure hypercholesterolemia 09/06/2018  . Total knee replacement status 09/06/2018  . Sepsis (HCC) 11/04/2014  . Cholelithiasis with choledocholithiasis 11/04/2014  . Elevated transaminase level 11/04/2014  . Hypokalemia 11/04/2014  . Liver lesion 11/04/2014  . Coronary atherosclerosis 11/04/2014  . SIRS (systemic inflammatory response syndrome) (HCC) 11/04/2014  . Acute encephalopathy 11/04/2014  . Lactic acidosis 11/04/2014  . Hypertension   . Hyperlipemia     Past Medical History:  Diagnosis Date  . Anxiety   . Arthritis   . Atherosclerosis   . Cholelithiasis with choledocholithiasis   . Diabetes mellitus without complication (HCC)   . Diverticulosis   . Elevated lipids   . GERD (gastroesophageal reflux disease)   . Hyperlipemia   . Hypertension   . Liver lesion   . Sepsis (H[facility])      Transfusion:  None   Consultants (if any):  None  Discharged Condition: Improved  Hospital Course: Jesus LisDarryl H Recinos is an 73 y.o. male who was admitted 09/06/2018 with a diagnosis of degenerative arthrosis of right knee and went to the operating room on 09/06/2018 and underwent the above named procedures.    Surgeries:Procedure(s): COMPUTER ASSISTED TOTAL KNEE ARTHROPLASTY - RNFA on 09/06/2018  PRE-OPERATIVE DIAGNOSIS: Degenerative arthrosis of the right knee, primary  POST-OPERATIVE DIAGNOSIS:  Same  PROCEDURE:  Right total knee arthroplasty using computer-assisted navigation  SURGEON:  Jena GaussJames P Hooten, Jr. M.D.  ANESTHESIA: spinal  ESTIMATED BLOOD LOSS: 50 mL  FLUIDS REPLACED: 1300 mL of crystalloid  TOURNIQUET TIME: 95  minutes  DRAINS: 2 medium Hemovac drains  SOFT TISSUE RELEASES: Anterior cruciate ligament, posterior cruciate ligament, deep  medial collateral ligament, patellofemoral ligament  IMPLANTS UTILIZED: DePuy Attune size 5 posterior stabilized femoral component (cemented), size 6 rotating platform tibial component (cemented), 38 mm medialized dome patella (cemented), and a 7 mm stabilized rotating platform polyethylene insert.  INDICATIONS FOR SURGERY: Jesus LisDarryl H Bartok is a 73 y.o. year old male with a long history of progressive knee pain. X-rays demonstrated severe degenerative changes in tricompartmental fashion. The patient had not seen any significant improvement despite conservative nonsurgical intervention. After discussion of the risks and benefits of surgical intervention, the patient expressed understanding of the risks benefits and agree with plans for total knee arthroplasty.   The risks, benefits, and alternatives were discussed at length including but not limited to the risks of infection, bleeding, nerve injury, stiffness, blood clots, the need for revision surgery, cardiopulmonary complications, among others, and they were willing to proceed. Patient tolerated the surgery well. No complications .Patient was taken to PACU where she was stabilized and then transferred to the orthopedic floor.  Patient started on Lovenox 30 mg q 12 hrs. Foot pumps applied bilaterally at 80 mm hgb. Heels elevated off bed with rolled towels. No evidence of DVT. Calves non tender. Negative [place]oman. Physical therapy started on day #1 for gait training and transfer with OT starting on  day #1 for ADL and assisted devices. Patient has done well with therapy. Ambulated 200 feet upon being discharged. Was able to ascend and descend 4 step safely an independently   Patient's IV And Foley were discontinued on day #1 with Hemovac being  discontinued on day #2. Dressing was changed on day 2 prior to patient being  discharged   He was given perioperative antibiotics:  Anti-infectives (From admission, onward)   Start     Dose/Rate Route Frequency Ordered Stop   09/06/18 1730  ceFAZolin (ANCEF) IVPB 2g/100 mL premix     2 g 200 mL/hr over 30 Minutes Intravenous Every 6 hours 09/06/18 1636 09/07/18 1236   09/06/18 0600  ceFAZolin (ANCEF) IVPB 2g/100 mL premix     2 g 200 mL/hr over 30 Minutes Intravenous On call to O.R. 09/05/18 2208 09/06/18 1139    .  He was fitted with AV 1 compression foot pump devices, instructed on heel pumps, early ambulation, and fitted with TED stockings bilaterally for DVT prophylaxis.  He benefited maximally from the hospital stay and there were no complications.    Recent vital signs:  Vitals:   09/07/18 0922 09/07/18 1142  BP: 135/66 134/70  Pulse:  68  Resp:    Temp:  97.9 F (36.6 C)  SpO2:  98%    Recent laboratory studies:  Lab Results  Component Value Date   HGB 14.3 08/30/2018   HGB 14.7 12/14/2016   HGB 13.9 11/06/2014   Lab Results  Component Value Date   WBC 6.4 08/30/2018   PLT 224 08/30/2018   Lab Results  Component Value Date   INR 0.9 08/30/2018   Lab Results  Component Value Date   NA 138 08/30/2018   K 3.7 08/30/2018   CL 101 08/30/2018   CO2 28 08/30/2018   BUN 14 08/30/2018   CREATININE 0.78 08/30/2018   GLUCOSE 98 08/30/2018    Discharge Medications:   Allergies as of 09/07/2018      Reactions   Lisinopril Swelling   Lips and face       Medication List    TAKE these medications   acetaminophen 500 MG tablet Commonly known as: TYLENOL Take 1,000 mg by mouth every 6 (six) hours as needed (pain).   amLODipine 10 MG tablet Commonly known as: NORVASC Take 10 mg by mouth daily.   Cranberry 500 MG Tabs Take 500 mg by mouth daily.   enoxaparin 40 MG/0.4ML injection Commonly known as: LOVENOX Inject 0.4 mLs (40 mg total) into the skin daily for 14 days. Start taking on: September 09, 2018   Fish Oil 1000 MG  Caps Take 1,000 mg by mouth daily.   hydrochlorothiazide 25 MG tablet Commonly known as: HYDRODIURIL Take 25 mg by mouth daily.   losartan 50 MG tablet Commonly known as: COZAAR Take 75 mg by mouth daily.   lovastatin 40 MG tablet Commonly known as: MEVACOR Take 2 tablets (80 mg total) by mouth at bedtime. HOLD until LFT's are normalized   meloxicam 7.5 MG tablet Commonly known as: MOBIC Take 7.5 mg by mouth daily.   Milk Thistle 1000 MG Caps Take 1,000 mg by mouth daily.   multivitamin with minerals Tabs tablet Take 1 tablet by mouth daily. Centrum Silver   omeprazole 20 MG capsule Commonly known as: PRILOSEC Take 20 mg by mouth daily.   oxyCODONE 5 MG immediate release tablet Commonly known as: Oxy IR/ROXICODONE Take 1 tablet (5 mg total) by mouth every 4 (four) hours as needed for moderate pain (pain score 4-6).   PARoxetine 20 MG tablet Commonly known as: PAXIL Take 20 mg by mouth daily.   tetrahydrozoline 0.05 % ophthalmic solution Place 1 drop into both eyes daily as needed (eye irritation.).  traMADol 50 MG tablet Commonly known as: ULTRAM Take 1-2 tablets (50-100 mg total) by mouth every 6 (six) hours as needed for moderate pain.   Zinc 50 MG Tabs Take 1 tablet by mouth daily.            Durable Medical Equipment  (From admission, onward)         Start     Ordered   09/06/18 1637  DME Walker rolling  Once    Question:  Patient needs a walker to treat with the following condition  Answer:  Total knee replacement status   09/06/18 1636   09/06/18 1637  DME Bedside commode  Once    Question:  Patient needs a bedside commode to treat with the following condition  Answer:  Total knee replacement status   09/06/18 1636          Diagnostic Studies: US Venous Img Lower Unilateral Right  Result Date: 08/18/2018 CLINICAL DATA:  Right lower extremity pain. EXAM: RIGHT LOWER EXTREMITY VENOUS DOPPLER ULTRASOUND TECHNIQUE: Gray-scale sonography with  graded compression, as well as color Doppler and duplex ultrasound were performed to evaluate the lower extremity deep venous systems from the level of the common femoral vein and including the common femoral, femoral, profunda femoral, popliteal and calf veins including the posterior tibial, peroneal and gastrocnemius veins when visible. The superficial great saphenous vein was also interrogated. Spectral Doppler was utilized to evaluate flow at rest and with distal augmentation maneuvers in the common femoral, femoral and popliteal veins. COMPARISON:  No prior. FINDINGS: Contralateral Common Femoral Vein: Respiratory phasicity is normal and symmetric with the symptomatic side. No evidence of thrombus. Normal compressibility. Common Femoral Vein: No evidence of thrombus. Normal compressibility, respiratory phasicity and response to augmentation. Saphenofemoral Junction: No evidence of thrombus. Normal compressibility and flow on color Doppler imaging. Profunda Femoral Vein: No evidence of thrombus. Normal compressibility and flow on color Doppler imaging. Femoral Vein: No evidence of thrombus. Normal compressibility, respiratory phasicity and response to augmentation. Popliteal Vein: No evidence of thrombus. Normal compressibility, respiratory phasicity and response to augmentation. Calf Veins: Limited visualization.  No thrombus noted. Other Findings:  None. IMPRESSION: No evidence of deep venous thrombosis. Electronically Signed   By: Marcello Moores  Register   On: 08/18/2018 15:02   Dg Knee Right Port  Result Date: 09/06/2018 CLINICAL DATA:  Right knee arthroplasty EXAM: PORTABLE RIGHT KNEE - 1-2 VIEW COMPARISON:  None. FINDINGS: The right knee demonstrates a total knee arthroplasty without evidence of hardware failure complication. There is no significant joint effusion. There is no fracture or dislocation. The alignment is anatomic. Surgical drains are present. Post-surgical changes noted in the surrounding soft  tissues. Incidental sessile osteochondroma arising from the distal medial femoral metaphysis. Peripheral vascular atherosclerotic disease. IMPRESSION: Interval right total knee arthroplasty. Electronically Signed   By: Kathreen Devoid   On: 09/06/2018 15:22    Disposition:   Discharge Instructions    Increase activity slowly   Complete by: As directed       Follow-up Information    Watt Climes, PA On 09/20/2018.   Specialty: Physician Assistant Why: at 9:15am Contact information: [place] [address] [phone]        Dereck Leep, MD On 10/19/2018.   Specialty: Orthopedic Surgery Why: at 10:00am Contact information: [place] [address] [phone]            Signed: Watt Climes 09/07/2018, 1:50  PM   

## 2018-09-07 NOTE — TOC Benefit Eligibility Note (Signed)
Transition of Care [facility]) Benefit Eligibility Note    Patient Details  Name: Jesus White MRN: [id number] Date of Birth: [birth year]   Medication/Dose: Enoxaparin 40mg  once daily for 14 days  Covered?: Yes  Tier: Other(Tier 4)  Prescription Coverage [facility]  Spoke with Person/Company/Phone Number:: Lorriane Shire with [employer] at [phone]  Co-Pay: $77.88 estimated retail cost  Prior Approval: No  Deductible: Unmet($50 deductible, unmet as of time of call.)  Additional Notes: Second attempt to check. This rep was able to provide estimated retail cost.    Dannette Barbara Phone Number: [phone] or [phone] 09/07/2018, 2:05 PM

## 2018-09-07 NOTE — Evaluation (Signed)
Physical Therapy Evaluation Patient Details Name: Jesus White MRN: [id number] DOB: Dec 31, 1945 Today's Date: 09/07/2018   History of Present Illness  73yo male s/p R TKA on 09/06/18.  Clinical Impression  Pt did very well with PT exam and showed good effort with all exercises and gait training apart from the eval.  He showed excellent quad engagement and confidence with AROM/resisted acts, he showed great effort with mobility and with some initial warm up/cuing did very well with gait training.  Pt with very good first session POD1; pt pleasant and eager to work further with PT.      Follow Up Recommendations Home health PT    Equipment Recommendations  None recommended by PT    Recommendations for Other Services       Precautions / Restrictions Precautions Precautions: Knee Precaution Booklet Issued: No Precaution Comments: indep w/ SLR, no KI needed Restrictions Weight Bearing Restrictions: Yes RLE Weight Bearing: Weight bearing as tolerated      Mobility  Bed Mobility Overal bed mobility: Independent             General bed mobility comments: Pt easily gets himself sitting at EOB w/o assist  Transfers Overall transfer level: Needs assistance Equipment used: Rolling walker (2 wheeled) Transfers: Sit to/from Stand Sit to Stand: Min guard            Ambulation/Gait Ambulation/Gait assistance: Modified independent (Device/Increase time) Gait Distance (Feet): 200 Feet Assistive device: Rolling walker (2 wheeled)       General Gait Details: Pt needing ~50 ft of slower ambulation to warm up and adjust to new knee, but was then able to maintain excellent cadence, speed with minimal UE use of walker.  Pt's O2 remained in the high 90s t/o the effort with only minimal subjective fatigue, ultimately did very well with first bout of ambulation.   Stairs            Wheelchair Mobility    Modified Rankin (Stroke Patients Only)       Balance Overall  balance assessment: Modified Independent Sitting-balance support: Feet supported Sitting balance-Leahy Scale: Normal     Standing balance support: Bilateral upper extremity supported Standing balance-Leahy Scale: Good                               Pertinent Vitals/Pain Pain Assessment: 0-10 Pain Score: 6  Pain Location: R knee Pain Descriptors / Indicators: Aching Pain Intervention(s): Limited activity within patient's tolerance;Monitored during session;Repositioned;Ice applied    Home Living Family/patient expects to be discharged to:: Private residence Living Arrangements: Spouse/significant other Available Help at Discharge: Family;Available 24 hours/day Type of Home: House Home Access: Stairs to enter   [employer] of Steps: 2 from garage with no rails (primary); 5-6 front with rail and wall Home Layout: One level Home Equipment: Shower seat - built in;Walker - 2 wheels;Toilet riser      Prior Function Level of Independence: Independent         Comments: PRN use of RW at home 2/2 knee pain; indep with ADL, driving, no falls     Hand Dominance   Dominant Hand: Right    Extremity/Trunk Assessment   Upper Extremity Assessment Upper Extremity Assessment: Overall WFL for tasks assessed    Lower Extremity Assessment Lower Extremity Assessment: Overall WFL for tasks assessed(expected R post-op weakness, but WFL t/o)    Cervical / Trunk Assessment Cervical / Trunk Assessment: Normal  Communication   Communication: No difficulties  Cognition Arousal/Alertness: Awake/alert Behavior During Therapy: WFL for tasks assessed/performed Overall Cognitive Status: Within Functional Limits for tasks assessed                                        General Comments      Exercises Total Joint Exercises Ankle Circles/Pumps: Strengthening;10 reps Quad Sets: Strengthening;10 reps Short Arc Quad: Strengthening;15 reps Heel Slides:  Strengthening;10 reps Hip ABduction/ADduction: Strengthening;10 reps Straight Leg Raises: Strengthening;10 reps Knee Flexion: PROM;5 reps Goniometric ROM: 0-92 Other Exercises Other Exercises: Pt instructed in AE/DME, home/routines modifications, falls prevention including pet care considerations, compression stocking mgt, and polar care mgt; handout provided to support recall and carryover   Assessment/Plan    PT Assessment Patient needs continued PT services  PT Problem List Decreased strength;Decreased range of motion;Decreased activity tolerance;Decreased balance;Decreased mobility;Decreased knowledge of use of DME;Cardiopulmonary status limiting activity;Pain;Decreased safety awareness       PT Treatment Interventions DME instruction;Gait training;Stair training;Functional mobility training;Therapeutic activities;Therapeutic exercise;Balance training;Neuromuscular re-education;Patient/family education    PT Goals (Current goals can be found in the Care Plan section)  Acute Rehab PT Goals Patient Stated Goal: go home and return to PLOF with less knee pain PT Goal Formulation: With patient Time For Goal Achievement: 09/21/18 Potential to Achieve Goals: Good    Frequency BID   Barriers to discharge        Co-evaluation               AM-PAC PT "6 Clicks" Mobility  Outcome Measure Help needed turning from your back to your side while in a flat bed without using bedrails?: None Help needed moving from lying on your back to sitting on the side of a flat bed without using bedrails?: None Help needed moving to and from a bed to a chair (including a wheelchair)?: None Help needed standing up from a chair using your arms (e.g., wheelchair or bedside chair)?: None Help needed to walk in hospital room?: None Help needed climbing 3-5 steps with a railing? : A Little 6 Click Score: 23    End of Session Equipment Utilized During Treatment: Gait belt Activity Tolerance: Patient  tolerated treatment well Patient left: with call bell/phone within reach;with chair alarm set Nurse Communication: Mobility status PT Visit Diagnosis: Muscle weakness (generalized) (M62.81);Difficulty in walking, not elsewhere classified (R26.2);Pain Pain - Right/Left: Right Pain - part of body: Knee    Time: 0920-0957 PT Time Calculation (min) (ACUTE ONLY): 37 min   Charges:   PT Evaluation $PT Eval Low Complexity: 1 Low PT Treatments $Gait Training: 8-22 mins $Therapeutic Exercise: 8-22 mins        Malachi ProGalen R Rosanne Wohlfarth, DPT 09/07/2018, 10:52 AM

## 2018-09-08 LAB — SURGICAL PATHOLOGY

## 2018-09-08 NOTE — Progress Notes (Addendum)
Per Vance Peper, its okay for pt to be discharge without his Lovenox prescription at this time. "He will take care of it".   1120 reprinted AVS, Rx in place.

## 2018-09-08 NOTE — TOC Transition Note (Signed)
Transition of Care [facility]) - CM/SW Discharge Note   Patient Details  Name: Jesus White MRN: [id number] Date of Birth: 06/03/1945  [facility] CM/SW Contact:  Su Hilt, RN Phone Number: 09/08/2018, 9:47 AM   Clinical Narrative:    Met with the patient to determine needs, He lives at home with his Wife Jesus White, He has a RW and high rise Toilet at home and doe snot have any other needs, [facility] chose [facility] PT and I notified Helene Kelp, His wife provides transportation, he is up top date with his PCP and can afford his medication, Lovenox is $77.88 and he states that is fine   Final next level of care: Home w Home Health Services Barriers to Discharge: Barriers Resolved   Patient Goals and CMS Choice Patient states their goals for this hospitalization and ongoing recovery are:: go home CMS Medicare.gov Compare Post Acute Care list provided to:: Patient Choice offered to / list presented to : Patient  Discharge Placement                       Discharge Plan and Services   Discharge Planning Services: CM Consult Post Acute Care Choice: Home Health          DME Arranged: N/A         HH Arranged: PT HH Agency: [facility] [employer] Date [place]: 09/08/18 Time [place]: [phone] Representative spoke with at [place]: [place] ([place]) Interventions     Readmission Risk Interventions No flowsheet data found.

## 2018-09-08 NOTE — Progress Notes (Signed)
Physical Therapy Treatment Patient Details Name: Jesus White MRN: [id number] DOB: 26-Feb-1945 Today's Date: 09/08/2018    History of Present Illness 73yo male s/p R TKA on 09/06/18.    PT Comments    Pt pleasant and reporting no pain this morning.  He had several questions regarding Polar Care use and HEP and PT took time to answer and demonstrate as needed.  Pt able to perform all supine there ex without manual assistance, required VC's for some proper execution.  He demonstrated good quad control and ability to flex R knee >90 degrees.  Pt has met all PT requirements for safe functional mobility around home.  He will continue to benefit from skilled PT with focus on strength, pain management and safe functional mobility.   Follow Up Recommendations  Home health PT     Equipment Recommendations  None recommended by PT    Recommendations for Other Services       Precautions / Restrictions Precautions Precautions: Knee Precaution Booklet Issued: No Precaution Comments: indep w/ SLR, no KI needed Restrictions Weight Bearing Restrictions: Yes RLE Weight Bearing: Weight bearing as tolerated    Mobility  Bed Mobility Overal bed mobility: (Deferred)                Transfers                    Ambulation/Gait                 Stairs             Wheelchair Mobility    Modified Rankin (Stroke Patients Only)       Balance                                            Cognition Arousal/Alertness: Awake/alert Behavior During Therapy: WFL for tasks assessed/performed Overall Cognitive Status: Within Functional Limits for tasks assessed                                        Exercises Total Joint Exercises Ankle Circles/Pumps: AROM;20 reps;Supine;Strengthening Quad Sets: Strengthening;15 reps;Right;Supine Short Arc Quad: Strengthening;15 reps;Supine;Right Heel Slides: Strengthening;15 reps;Supine Hip  ABduction/ADduction: Strengthening;15 reps;Supine Straight Leg Raises: Strengthening;5 reps Knee Flexion: PROM;5 reps Other Exercises Other Exercises: Education on polar care use x4 min Other Exercises: Education regarding structuring HEP schedule and what to expect with HH PT. x4 min    General Comments        Pertinent Vitals/Pain Pain Assessment: No/denies pain    Home Living                      Prior Function            PT Goals (current goals can now be found in the care plan section) Acute Rehab PT Goals Patient Stated Goal: go home and return to PLOF with less knee pain PT Goal Formulation: With patient Time For Goal Achievement: 09/21/18 Potential to Achieve Goals: Good Progress towards PT goals: Progressing toward goals    Frequency    BID      PT Plan Current plan remains appropriate    Co-evaluation              AM-PAC PT "6 Clicks" Mobility  Outcome Measure  Help needed turning from your back to your side while in a flat bed without using bedrails?: None Help needed moving from lying on your back to sitting on the side of a flat bed without using bedrails?: None Help needed moving to and from a bed to a chair (including a wheelchair)?: None Help needed standing up from a chair using your arms (e.g., wheelchair or bedside chair)?: None Help needed to walk in hospital room?: None Help needed climbing 3-5 steps with a railing? : None 6 Click Score: 24    End of Session   Activity Tolerance: Patient tolerated treatment well Patient left: in bed;with nursing/sitter in room;with bed alarm set;with call bell/phone within reach Nurse Communication: Mobility status PT Visit Diagnosis: Muscle weakness (generalized) (M62.81);Difficulty in walking, not elsewhere classified (R26.2);Pain Pain - Right/Left: Right Pain - part of body: Knee     Time: 5300-5110 PT Time Calculation (min) (ACUTE ONLY): 16 min  Charges:  $Therapeutic Exercise:  8-22 mins                     Roxanne Gates, PT, DPT    Roxanne Gates 09/08/2018, 9:08 AM

## 2018-09-08 NOTE — Progress Notes (Signed)
Pt to be discharge home, dressing changed and ted hose applied. I will provide 2 honeycomb dressings as instructed by order. Wife has been notified by patient.

## 2018-09-08 NOTE — Progress Notes (Addendum)
   Subjective: 2 Days Post-Op Procedure(s) (LRB): COMPUTER ASSISTED TOTAL KNEE ARTHROPLASTY - RNFA (Right) Patient reports pain as mild.   Patient is well, and has had no acute complaints or problems Patient did very well with physical therapy yesterday without any complications Plan is to go Home after hospital stay. no nausea and no vomiting Patient denies any chest pains or shortness of breath. Objective: Vital signs in last 24 hours: Temp:  [97.5 F (36.4 C)-97.9 F (36.6 C)] 97.5 F (36.4 C) (08/19 0806) Pulse Rate:  [66-70] 70 (08/19 0806) Resp:  [18] 18 (08/19 0058) BP: (121-144)/(66-85) 144/77 (08/19 0806) SpO2:  [97 %-98 %] 97 % (08/19 0806) well approximated incision Heels are non tender and elevated off the bed using rolled towels Intake/Output from previous day: 08/18 0701 - 08/19 0700 In: 240 [P.O.:240] Out: 1340 [Urine:1050; Drains:290] Intake/Output this shift: No intake/output data recorded.  No results for input(s): HGB in the last 72 hours. No results for input(s): WBC, RBC, HCT, PLT in the last 72 hours. No results for input(s): NA, K, CL, CO2, BUN, CREATININE, GLUCOSE, CALCIUM in the last 72 hours. No results for input(s): LABPT, INR in the last 72 hours.  EXAM General - Patient is Alert, Appropriate and Oriented Extremity - Neurologically intact Neurovascular intact Sensation intact distally Intact pulses distally Dorsiflexion/Plantar flexion intact No cellulitis present Compartment soft Dressing - moderate drainage Motor Function - intact, moving foot and toes well on exam.    Past Medical History:  Diagnosis Date  . Anxiety   . Arthritis   . Atherosclerosis   . Cholelithiasis with choledocholithiasis   . Diabetes mellitus without complication ([place])   . Diverticulosis   . Elevated lipids   . GERD (gastroesophageal reflux disease)   . Hyperlipemia   . Hypertension   . Liver lesion   . Sepsis ([place])     Assessment/Plan: 2 Days Post-Op  Procedure(s) (LRB): COMPUTER ASSISTED TOTAL KNEE ARTHROPLASTY - RNFA (Right) Active Problems:   Total knee replacement status  Estimated body mass index is 23.71 kg/m as calculated from the following:   Height as of this encounter: 5\' 11"  (1.803 m).   Weight as of this encounter: 77.1 kg. D/C IV fluids Discharge home with home health  Labs: None DVT Prophylaxis - Lovenox, Foot Pumps and TED hose Weight-Bearing as tolerated to right leg Please wash operative leg, change dressing and apply TED stockings to operative leg Please give the patient 2 extra honeycomb dressings to take home Hemovac was removed today.  Ends of the drain appeared to be intact  Tameah Mihalko R. Carmel Bushnell 09/08/2018, 8:31 AM

## 2019-12-17 NOTE — Discharge Instructions (Signed)
Instructions after Total Knee Replacement   Eylin Pontarelli P. Charrise Lardner, Jr., M.D.     Dept. of Orthopaedics & Sports Medicine  [facility]  [address]  [place], [place]  [address]  Phone: [phone]   Fax: [phone]    DIET: Drink plenty of non-alcoholic fluids. Resume your normal diet. Include foods high in fiber.  ACTIVITY:  You may use crutches or a walker with weight-bearing as tolerated, unless instructed otherwise. You may be weaned off of the walker or crutches by your Physical Therapist.  Do NOT place pillows under the knee. Anything placed under the knee could limit your ability to straighten the knee.   Continue doing gentle exercises. Exercising will reduce the pain and swelling, increase motion, and prevent muscle weakness.   Please continue to use the TED compression stockings for 6 weeks. You may remove the stockings at night, but should reapply them in the morning. Do not drive or operate any equipment until instructed.  WOUND CARE:  Continue to use the PolarCare or ice packs periodically to reduce pain and swelling. You may bathe or shower after the staples are removed at the first office visit following surgery.  MEDICATIONS: You may resume your regular medications. Please take the pain medication as prescribed on the medication. Do not take pain medication on an empty stomach. You have been given a prescription for a blood thinner (Lovenox or Coumadin). Please take the medication as instructed. (NOTE: After completing a 2 week course of Lovenox, take one Enteric-coated aspirin once a day. This along with elevation will help reduce the possibility of phlebitis in your operated leg.) Do not drive or drink alcoholic beverages when taking pain medications.  CALL THE OFFICE FOR: Temperature above 101 degrees Excessive bleeding or drainage on the dressing. Excessive swelling, coldness, or paleness of the toes. Persistent nausea and vomiting.  FOLLOW-UP:  You  should have an appointment to return to the office in 10-14 days after surgery. Arrangements have been made for continuation of Physical Therapy (either home therapy or outpatient therapy).   [facility] Department Directory         [email or web address]       [email or web address]          Cardiology  Appointments: [place] - [phone] [place] - [phone]  Endocrinology  Appointments: [place] - [phone] [place] - [phone]  Gastroenterology  Appointments: [place] - [phone] [place] - [phone]        General Surgery   Appointments: [place] - [phone]  Internal Medicine/Family Medicine  Appointments: [place] - [phone] [place] - [phone] [place] - [phone]  Metabolic and Weigh Loss Surgery  Appointments: [place] - [phone]        Neurology  Appointments: [place] - [phone] [place] - [phone]  Neurosurgery  Appointments: [place] - [phone]  Obstetrics & Gynecology  Appointments: [place] - [phone] [place] - [phone]        Pediatrics  Appointments: [place] - [phone] [place] - [phone]  Physiatry  Appointments: [place] -[phone]  Physical Therapy  Appointments: [place] - [phone] [place] - [phone]        Podiatry  Appointments: [place] - [phone] [place] - [phone]  Pulmonology  Appointments: [place] - [phone]  Rheumatology  Appointments: [place] - [phone]        [place] Location: [facility]  [address] [place], [place]  [address]  [place] Location: [facility] [address] [place], [place]  [address]  [place] Location: [facility] [address] [place], [place]  [address]    

## 2019-12-21 ENCOUNTER — Other Ambulatory Visit: Payer: Self-pay

## 2019-12-21 ENCOUNTER — Encounter
Admission: RE | Admit: 2019-12-21 | Discharge: 2019-12-21 | Disposition: A | Discharge status: Home / Self Care | Payer: Medicare Other | Source: Ambulatory Visit | Attending: Orthopedic Surgery | Admitting: Orthopedic Surgery

## 2019-12-21 DIAGNOSIS — Z01818 Encounter for other preprocedural examination: Secondary | ICD-10-CM | POA: Diagnosis present

## 2019-12-21 DIAGNOSIS — Z01810 Encounter for preprocedural cardiovascular examination: Secondary | ICD-10-CM

## 2019-12-21 LAB — COMPREHENSIVE METABOLIC PANEL
ALT: 18 U/L (ref 0–44)
AST: 22 U/L (ref 15–41)
Albumin: 4.5 g/dL (ref 3.5–5.0)
Alkaline Phosphatase: 51 U/L (ref 38–126)
Anion gap: 10 (ref 5–15)
BUN: 15 mg/dL (ref 8–23)
CO2: 26 mmol/L (ref 22–32)
Calcium: 9.6 mg/dL (ref 8.9–10.3)
Chloride: 101 mmol/L (ref 98–111)
Creatinine, Ser: 0.77 mg/dL (ref 0.61–1.24)
GFR, Estimated: >60 mL/min (ref >60)
Glucose, Bld: 124 mg/dL — ABNORMAL HIGH (ref 70–99)
Potassium: 3.4 mmol/L — ABNORMAL LOW (ref 3.5–5.1)
Sodium: 137 mmol/L (ref 135–145)
Total Bilirubin: 1 mg/dL (ref 0.3–1.2)
Total Protein: 7.5 g/dL (ref 6.5–8.1)

## 2019-12-21 LAB — URINALYSIS, ROUTINE W REFLEX MICROSCOPIC
Bilirubin Urine: NEGATIVE
Glucose, UA: NEGATIVE mg/dL
Hgb urine dipstick: NEGATIVE
Ketones, ur: NEGATIVE mg/dL
Leukocytes,Ua: NEGATIVE
Nitrite: NEGATIVE
Protein, ur: NEGATIVE mg/dL
Specific Gravity, Urine: 1.014 (ref 1.005–1.030)
pH: 6 (ref 5.0–8.0)

## 2019-12-21 LAB — CBC
HCT: 42 % (ref 39.0–52.0)
Hemoglobin: 14.4 g/dL (ref 13.0–17.0)
MCH: 31.5 pg (ref 26.0–34.0)
MCHC: 34.3 g/dL (ref 30.0–36.0)
MCV: 91.9 fL (ref 80.0–100.0)
Platelets: 193 10*3/uL (ref 150–400)
RBC: 4.57 MIL/uL (ref 4.22–5.81)
RDW: 12.7 % (ref 11.5–15.5)
WBC: 4.2 10*3/uL (ref 4.0–10.5)
nRBC: 0 % (ref 0.0–0.2)

## 2019-12-21 LAB — PROTIME-INR
INR: 0.9 (ref 0.8–1.2)
Prothrombin Time: 11.9 seconds (ref 11.4–15.2)

## 2019-12-21 LAB — SURGICAL PCR SCREEN
MRSA, PCR: NEGATIVE
Staphylococcus aureus: POSITIVE — AB

## 2019-12-21 LAB — SEDIMENTATION RATE: Sed Rate: 7 mm/hr (ref 0–20)

## 2019-12-21 LAB — APTT: aPTT: 28 seconds (ref 24–36)

## 2019-12-21 LAB — C-REACTIVE PROTEIN: CRP: 0.5 mg/dL (ref <1.0)

## 2019-12-21 NOTE — Patient Instructions (Signed)
Your procedure is scheduled on:December 28, 2019 Wednesday  Report to the Registration Desk on the 1st floor of the [facility]. To find out your arrival time, please call [phone] between 1PM - 3PM on: Tuesday December 27, 2019  REMEMBER: Instructions that are not followed completely may result in serious medical risk, up to and including death; or upon the discretion of your surgeon and anesthesiologist your surgery may need to be rescheduled.  Do not eat food after midnight the night before surgery.  No gum chewing, lozengers or hard candies.  You may however, drink CLEAR liquids up to 2 hours before you are scheduled to arrive for your surgery. Do not drink anything within 2 hours of your scheduled arrival time.  Clear liquids include: - water  - apple juice without pulp - black coffee or tea (Do NOT add milk or creamers to the coffee or tea) Do NOT drink anything that is not on this list.  Type 1 and Type 2 diabetics should only drink water.  In addition, your doctor has ordered for you to drink the provided  Ensure Pre-Surgery Clear Carbohydrate Drink  Drinking this carbohydrate drink up to two hours before surgery helps to reduce insulin resistance and improve patient outcomes. Please complete drinking 2 hours prior to scheduled arrival time.  TAKE THESE MEDICATIONS THE MORNING OF SURGERY WITH A SIP OF WATER: TYLENOL IF NEEDED AMLODIPINE PAROXETINE OMEPRAZOLE (take one the night before and one on the morning of surgery - helps to prevent nausea after surgery.)  One week prior to surgery: Stop Anti-inflammatories (NSAIDS) such as Advil, Aleve, Ibuprofen, Motrin, Naproxen, Naprosyn and ASPIRIN OR Aspirin based products such as Excedrin, Goodys Powder, BC Powder.  USE TYLENOL IS NEEDED Stop ANY OVER THE COUNTER supplements until after surgery. (However, you may continue taking Vitamin D, Vitamin B, and multivitamin up until the day before surgery.)  No Alcohol for 24  hours before or after surgery.  No Smoking including e-cigarettes for 24 hours prior to surgery.  No chewable tobacco products for at least 6 hours prior to surgery.  No nicotine patches on the day of surgery.  Do not use any "recreational" drugs for at least a week prior to your surgery.  Please be advised that the combination of cocaine and anesthesia may have negative outcomes, up to and including death. If you test positive for cocaine, your surgery will be cancelled.  On the morning of surgery brush your teeth with toothpaste and water, you may rinse your mouth with mouthwash if you wish. Do not swallow any toothpaste or mouthwash.  Do not wear jewelry, make-up, hairpins, clips or nail polish.  Do not wear lotions, powders, or perfumes OR DEODORANT  Do not shave body from the neck down 48 hours prior to surgery just in case you cut yourself which could leave a site for infection.  Also, freshly shaved skin may become irritated if using the CHG soap.  Contact lenses, hearing aids and dentures may not be worn into surgery.  Do not bring valuables to the hospital. [facility] is not responsible for any missing/lost belongings or valuables.   Use CHG Soap as directed on instruction sheet.  Notify your doctor if there is any change in your medical condition (cold, fever, infection).  Wear comfortable clothing (specific to your surgery type) to the hospital.  Plan for stool softeners for home use; pain medications have a tendency to cause constipation. You can also help prevent constipation by eating  foods high in fiber such as fruits and vegetables and drinking plenty of fluids as your diet allows.  After surgery, you can help prevent lung complications by doing breathing exercises.  Take deep breaths and cough every 1-2 hours. Your doctor may order a device called an Incentive Spirometer to help you take deep breaths. When coughing or sneezing, hold a pillow firmly against your  incision with both hands. This is called splinting. Doing this helps protect your incision. It also decreases belly discomfort.  If you are being admitted to the hospital overnight, YOU MAY BRING A SMALL BAG WITH YOU.  If you are being discharged the day of surgery, you will not be allowed to drive home. You will need a responsible adult (18 years or older) to drive you home and stay with you that night.   If you are taking public transportation, you will need to have a responsible adult (18 years or older) with you. Please confirm with your physician that it is acceptable to use public transportation.   Please call the Pre-admissions Testing Dept. at [phone] if you have any questions about these instructions.  Visitation Policy:  Patients undergoing a surgery or procedure may have one family member or support person with them as long as that person is not COVID-19 positive or experiencing its symptoms.  That person may remain in the waiting area during the procedure.  Inpatient Visitation Update:   In an effort to ensure the safety of our team members and our patients, we are implementing a change to our visitation policy:  Effective Monday, Aug. 9, at 7 a.m., inpatients will be allowed one support person.  o The support person may change daily.  o The support person must pass our screening, gel in and out, and wear a mask at all times, including in the patients room.  o Patients must also wear a mask when staff or their support person are in the room.  o Masking is required regardless of vaccination status.  Systemwide, no visitors 17 or younger.

## 2019-12-22 LAB — URINE CULTURE
Culture: NO GROWTH
Special Requests: NORMAL

## 2019-12-25 NOTE — H&P (Signed)
ORTHOPAEDIC HISTORY & PHYSICAL Progress Notes Latanya Maudlin, PA - 12/21/2019 10:45 AM EST Chief Complaint Chief Complaint  Patient presents with  . Knee Pain  H & P LEFT KNEE   Reason for Visit Jesus White is a 74 y.o. who presents today for a history and physical. He is to undergo a left total knee arthroplasty on 12/28/2019. Patient was last seen in the clinic on 09/06/2019. There have been no change in his condition since that time.  He reports a long history of left knee pain that has significantly increased in severity over the last several months. He localizes most of the pain along the medial aspect of the knee. He reports some swelling, no locking, and some giving way of the knee. The pain is aggravated by any weight bearing. The patient has not appreciated any significant improvement despite Tylenol and activity modification. He is not using any ambulatory aids. The left knee pain limits his ability to ambulate long distances.The patient states that the left knee pain has progressed to the point that it is significantly interfering with his activities of daily living.  The patient is 1 year status post right total knee arthroplasty. He denies any significant right knee pain, swelling, locking, or giving way. He states that the right knee is "doing great".  Past Medical History Past Medical History:  Diagnosis Date  . Anxiety  . Arthritis  . Chicken pox  . Diverticulosis  . GERD (gastroesophageal reflux disease)  . Hyperlipidemia  . Hypertension   Past Surgical History Past Surgical History:  Procedure Laterality Date  . CATARACT EXTRACTION Right 05/23/13  . CHOLECYSTECTOMY  . COLONOSCOPY  . RIGHT ELBOW SURGERY  . Right total knee arthroplasty using computer-assisted navigation 09/06/2018  Dr Ernest Pine  . WISDOM TEETH   Past Family History Family History  Problem Relation Age of Onset  . No Known Problems Mother  . No Known Problems Father  . No Known Problems  Sister  . No Known Problems Brother  . No Known Problems Brother  . No Known Problems Brother  . No Known Problems Brother  . No Known Problems Sister  . No Known Problems Sister  . No Known Problems Sister   Medications Current Outpatient Medications Ordered in Epic  Medication Sig Dispense Refill  . omega 3-dha-epa-fish oil (FISH OIL) 1,000 mg (120 mg-180 mg) Cap Take 1 capsule by mouth once daily  . acetaminophen (TYLENOL) 500 MG tablet Take 500 mg by mouth every 6 (six) hours as needed for Pain  . amLODIPine (NORVASC) 10 MG tablet TAKE 1 TABLET BY MOUTH ONCE DAILY 90 tablet 1  . chrm/vineg/bit-orang peel/gr t (APPLE CIDER VINEGAR PLUS ORAL) Take 20 mLs by mouth once daily  . cranberry fruit extract (CRANBERRY EXTRACT) 500 mg Tab Take 1 tablet by mouth once daily  . cyanocobalamin (VITAMIN B12) 1000 MCG tablet Take 1,000 mcg by mouth once daily  . hydroCHLOROthiazide (HYDRODIURIL) 25 MG tablet TAKE 1 TABLET BY MOUTH ONCE DAILY 90 tablet 1  . lidocaine 4 % PtMd Place 1 patch onto the skin once daily  . losartan (COZAAR) 50 MG tablet TAKE 1 AND 1/2 TABLETS BY MOUTH ONCE DAILY 135 tablet 3  . lovastatin (MEVACOR) 40 MG tablet TAKE 2 TABLETS BY MOUTH AT NIGHT 180 tablet 1  . MILK THISTLE ORAL Take 1,000 mg by mouth once daily  . multivitamin with minerals tablet Take 1 tablet by mouth once daily  . omeprazole (PRILOSEC) 20 MG DR  capsule TAKE 1 CAPSULE BY MOUTH ONCE DAILY 90 capsule 1  . PARoxetine (PAXIL) 20 MG tablet TAKE 1 TABLET BY MOUTH ONCE DAILY 90 tablet 1  . tetrahydrozoline (VISINE) 0.05 % ophthalmic solution Place 1 drop into both eyes 2 (two) times daily  . zinc 50 mg Tab Take 1 tablet by mouth once daily   No current Epic-ordered facility-administered medications on file.   Allergies Allergies  Allergen Reactions  . Lisinopril Swelling  Lips and face     Review of Systems A comprehensive 14 point ROS was performed, reviewed, and the pertinent orthopaedic findings  are documented in the HPI.  Exam BP 130/76  Ht 177.8 cm (5\' 10" )  Wt 80.8 kg (178 lb 3.2 oz)  BMI 25.57 kg/m   General: Well-developed well-nourished male seen in no acute distress.   HEENT: Atraumatic,normocephalic. Pupils are equal and reactive to light. Oropharynx is clear with moist mucosa  Lungs: Clear to auscultation bilaterally   Cardiovascular: Regular rate and rhythm. Normal S1, S2. Grade 2/6 systolic murmur (patient is aware of this and has been worked up in the past.). No appreciable gallops or rubs. Peripheral pulses are palpable.  Abdomen: Soft, non-tender, nondistended. Bowel sounds present  Extremity: Left Knee: Soft tissue swelling: minimal Effusion: none Erythema: none Crepitance: mild Tenderness: medial Alignment: relative varus Mediolateral laxity: medial pseudolaxity Posterior sag: negative Patellar tracking: Good tracking without evidence of subluxation or tilt Atrophy: No significant atrophy.  Quadriceps tone was good. Range of motion: 0/8/112 degrees  Neurological:  The patient is alert and oriented Sensation to light touch appears to be intact and within normal limits Gross motor strength appeared to be equal to 5/5  Vascular :  Peripheral pulses felt to be palpable. Capillary refill appears to be intact and within normal limits  X-ray  1. X-rays of the left knee taken in [facility] on 09/06/2019 showed narrowing of the medial cartilage space with bone-on-bone articulation and associated varus alignment. Osteophyte formation is noted. Subchondral sclerosis is noted. No evidence   Impression  1. Degenerative arthrosis left knee  Plan   1. Patient has discontinued his aspirin as of today. 2. Patient is planning on staying overnight 3. Return to clinic 2 weeks postop. Sooner if any problems 4. I did go over the patient's medication list on today's visit. 5. Patient had no questions about the surgery since she has been doing on  the right knee  This note was generated in part with voice recognition software and I apologize for any typographical errors that were not detected and corrected   09/08/2019 PA  Electronically signed by Tera Partridge, PA at 12/21/2019 10:26

## 2019-12-26 ENCOUNTER — Other Ambulatory Visit: Payer: Self-pay

## 2019-12-26 ENCOUNTER — Other Ambulatory Visit
Admission: RE | Admit: 2019-12-26 | Discharge: 2019-12-26 | Disposition: A | Discharge status: Home / Self Care | Payer: Medicare Other | Source: Ambulatory Visit | Attending: Orthopedic Surgery | Admitting: Orthopedic Surgery

## 2019-12-26 DIAGNOSIS — Z20822 Contact with and (suspected) exposure to covid-19: Secondary | ICD-10-CM | POA: Insufficient documentation

## 2019-12-26 DIAGNOSIS — Z01812 Encounter for preprocedural laboratory examination: Secondary | ICD-10-CM | POA: Insufficient documentation

## 2019-12-27 ENCOUNTER — Encounter: Payer: Self-pay | Admitting: Orthopedic Surgery

## 2019-12-27 LAB — TYPE AND SCREEN
ABO/RH(D): O POS
Antibody Screen: NEGATIVE

## 2019-12-27 LAB — SARS CORONAVIRUS 2 (TAT 6-24 HRS): SARS Coronavirus 2: NEGATIVE

## 2019-12-28 ENCOUNTER — Inpatient Hospital Stay: Payer: Medicare Other

## 2019-12-28 ENCOUNTER — Encounter: Payer: Self-pay | Admitting: Orthopedic Surgery

## 2019-12-28 ENCOUNTER — Other Ambulatory Visit: Payer: Self-pay

## 2019-12-28 ENCOUNTER — Inpatient Hospital Stay: Payer: Medicare Other | Admitting: Anesthesiology

## 2019-12-28 ENCOUNTER — Inpatient Hospital Stay
Admission: RE | Admit: 2019-12-28 | Discharge: 2019-12-29 | DRG: 470 | Disposition: A | Discharge status: Home Health | Payer: Medicare Other | Attending: Orthopedic Surgery | Admitting: Orthopedic Surgery

## 2019-12-28 ENCOUNTER — Encounter
Admission: RE | Disposition: A | Discharge status: Home Health | Payer: Self-pay | Source: Home / Self Care | Attending: Orthopedic Surgery

## 2019-12-28 DIAGNOSIS — Z20822 Contact with and (suspected) exposure to covid-19: Secondary | ICD-10-CM | POA: Diagnosis present

## 2019-12-28 DIAGNOSIS — Z888 Allergy status to other drugs, medicaments and biological substances status: Secondary | ICD-10-CM

## 2019-12-28 DIAGNOSIS — I10 Essential (primary) hypertension: Secondary | ICD-10-CM | POA: Diagnosis present

## 2019-12-28 DIAGNOSIS — K219 Gastro-esophageal reflux disease without esophagitis: Secondary | ICD-10-CM | POA: Diagnosis present

## 2019-12-28 DIAGNOSIS — Z96651 Presence of right artificial knee joint: Secondary | ICD-10-CM | POA: Diagnosis present

## 2019-12-28 DIAGNOSIS — Z9841 Cataract extraction status, right eye: Secondary | ICD-10-CM | POA: Diagnosis not present

## 2019-12-28 DIAGNOSIS — M1712 Unilateral primary osteoarthritis, left knee: Principal | ICD-10-CM | POA: Diagnosis present

## 2019-12-28 DIAGNOSIS — E785 Hyperlipidemia, unspecified: Secondary | ICD-10-CM | POA: Diagnosis present

## 2019-12-28 DIAGNOSIS — E7800 Pure hypercholesterolemia, unspecified: Secondary | ICD-10-CM | POA: Diagnosis present

## 2019-12-28 DIAGNOSIS — F419 Anxiety disorder, unspecified: Secondary | ICD-10-CM | POA: Diagnosis present

## 2019-12-28 DIAGNOSIS — Z96659 Presence of unspecified artificial knee joint: Secondary | ICD-10-CM

## 2019-12-28 HISTORY — PX: KNEE ARTHROPLASTY: SHX992

## 2019-12-28 SURGERY — ARTHROPLASTY, KNEE, TOTAL, USING IMAGELESS COMPUTER-ASSISTED NAVIGATION
Anesthesia: Spinal | Site: Knee | Laterality: Left

## 2019-12-28 MED ORDER — SURGIPHOR WOUND IRRIGATION SYSTEM - OPTIME
TOPICAL | Status: DC | PRN
  Administered 2019-12-28: 1 via TOPICAL

## 2019-12-28 MED ORDER — CEFAZOLIN SODIUM-DEXTROSE 2-4 GM/100ML-% IV SOLN
INTRAVENOUS | Status: AC
  Administered 2019-12-28: 2 g via INTRAVENOUS
  Filled 2019-12-28: qty 100

## 2019-12-28 MED ORDER — ENSURE PRE-SURGERY PO LIQD
296.0000 mL | Freq: Once | ORAL | Status: AC
  Administered 2019-12-28: 296 mL via ORAL
  Filled 2019-12-28: qty 296

## 2019-12-28 MED ORDER — ACETAMINOPHEN 10 MG/ML IV SOLN
1000.0000 mg | Freq: Four times a day (QID) | INTRAVENOUS | Status: AC
  Administered 2019-12-28 – 2019-12-29 (×3): 1000 mg via INTRAVENOUS
  Filled 2019-12-28 (×3): qty 100

## 2019-12-28 MED ORDER — PANTOPRAZOLE SODIUM 40 MG PO TBEC
40.0000 mg | DELAYED_RELEASE_TABLET | Freq: Two times a day (BID) | ORAL | Status: DC
  Administered 2019-12-28 – 2019-12-29 (×2): 40 mg via ORAL
  Filled 2019-12-28 (×2): qty 1

## 2019-12-28 MED ORDER — SODIUM CHLORIDE FLUSH 0.9 % IV SOLN
INTRAVENOUS | Status: AC
  Filled 2019-12-28: qty 40

## 2019-12-28 MED ORDER — PROPOFOL 10 MG/ML IV BOLUS
INTRAVENOUS | Status: DC | PRN
  Administered 2019-12-28: 50 mg via INTRAVENOUS

## 2019-12-28 MED ORDER — GABAPENTIN 300 MG PO CAPS
ORAL_CAPSULE | ORAL | Status: AC
  Administered 2019-12-28: 300 mg via ORAL
  Filled 2019-12-28: qty 1

## 2019-12-28 MED ORDER — TRAMADOL HCL 50 MG PO TABS
50.0000 mg | ORAL_TABLET | ORAL | Status: DC | PRN
  Administered 2019-12-28: 50 mg via ORAL

## 2019-12-28 MED ORDER — OXYCODONE HCL 5 MG PO TABS: 5.0000 mg | ORAL_TABLET | ORAL | Status: DC | PRN

## 2019-12-28 MED ORDER — SENNOSIDES-DOCUSATE SODIUM 8.6-50 MG PO TABS
1.0000 | ORAL_TABLET | Freq: Two times a day (BID) | ORAL | Status: DC
  Administered 2019-12-28 – 2019-12-29 (×2): 1 via ORAL
  Filled 2019-12-28 (×2): qty 1

## 2019-12-28 MED ORDER — PROPOFOL 500 MG/50ML IV EMUL
INTRAVENOUS | Status: AC
  Filled 2019-12-28: qty 50

## 2019-12-28 MED ORDER — SODIUM CHLORIDE 0.9 % IR SOLN
Status: DC | PRN
  Administered 2019-12-28: 500 mL

## 2019-12-28 MED ORDER — CHLORHEXIDINE GLUCONATE 0.12 % MT SOLN
OROMUCOSAL | Status: AC
  Administered 2019-12-28: 15 mL via OROMUCOSAL
  Filled 2019-12-28: qty 15

## 2019-12-28 MED ORDER — GABAPENTIN 300 MG PO CAPS
300.0000 mg | ORAL_CAPSULE | Freq: Every day | ORAL | Status: DC
  Administered 2019-12-28: 300 mg via ORAL
  Filled 2019-12-28: qty 1

## 2019-12-28 MED ORDER — ENOXAPARIN SODIUM 30 MG/0.3ML ~~LOC~~ SOLN
30.0000 mg | Freq: Two times a day (BID) | SUBCUTANEOUS | Status: DC
  Administered 2019-12-28 – 2019-12-29 (×2): 30 mg via SUBCUTANEOUS
  Filled 2019-12-28 (×2): qty 0.3

## 2019-12-28 MED ORDER — ADULT MULTIVITAMIN W/MINERALS CH
1.0000 | ORAL_TABLET | Freq: Every day | ORAL | Status: DC
  Administered 2019-12-29: 1 via ORAL
  Filled 2019-12-28: qty 1

## 2019-12-28 MED ORDER — MIDAZOLAM HCL 2 MG/2ML IJ SOLN
INTRAMUSCULAR | Status: AC
  Filled 2019-12-28: qty 2

## 2019-12-28 MED ORDER — GLYCOPYRROLATE 0.2 MG/ML IJ SOLN
INTRAMUSCULAR | Status: DC | PRN
  Administered 2019-12-28: .2 mg via INTRAVENOUS

## 2019-12-28 MED ORDER — ACETAMINOPHEN 325 MG PO TABS: 325.0000 mg | ORAL_TABLET | Freq: Four times a day (QID) | ORAL | Status: DC | PRN

## 2019-12-28 MED ORDER — ONDANSETRON HCL 4 MG/2ML IJ SOLN: 4.0000 mg | Freq: Once | INTRAMUSCULAR | Status: DC | PRN

## 2019-12-28 MED ORDER — FLEET ENEMA 7-19 GM/118ML RE ENEM: 1.0000 | ENEMA | Freq: Once | RECTAL | Status: DC | PRN

## 2019-12-28 MED ORDER — ACETAMINOPHEN 10 MG/ML IV SOLN
INTRAVENOUS | Status: DC | PRN
  Administered 2019-12-28: 1000 mg via INTRAVENOUS

## 2019-12-28 MED ORDER — SODIUM CHLORIDE 0.9 % IV SOLN
INTRAVENOUS | Status: DC | PRN
  Administered 2019-12-28: 25 ug/min via INTRAVENOUS

## 2019-12-28 MED ORDER — AMLODIPINE BESYLATE 10 MG PO TABS
10.0000 mg | ORAL_TABLET | Freq: Every day | ORAL | Status: DC
  Administered 2019-12-29: 10 mg via ORAL
  Filled 2019-12-28: qty 1

## 2019-12-28 MED ORDER — TRANEXAMIC ACID-NACL 1000-0.7 MG/100ML-% IV SOLN
INTRAVENOUS | Status: AC
  Administered 2019-12-28: 1000 mg via INTRAVENOUS
  Filled 2019-12-28: qty 100

## 2019-12-28 MED ORDER — FENTANYL CITRATE (PF) 100 MCG/2ML IJ SOLN
INTRAMUSCULAR | Status: AC
  Filled 2019-12-28: qty 2

## 2019-12-28 MED ORDER — CELECOXIB 200 MG PO CAPS
ORAL_CAPSULE | ORAL | Status: AC
  Administered 2019-12-28: 400 mg via ORAL
  Filled 2019-12-28: qty 1

## 2019-12-28 MED ORDER — HYDROMORPHONE HCL 1 MG/ML IJ SOLN: 0.5000 mg | INTRAMUSCULAR | Status: DC | PRN

## 2019-12-28 MED ORDER — PRAVASTATIN SODIUM 40 MG PO TABS
80.0000 mg | ORAL_TABLET | Freq: Every day | ORAL | Status: DC
  Administered 2019-12-28 – 2019-12-29 (×2): 80 mg via ORAL
  Filled 2019-12-28: qty 4
  Filled 2019-12-28: qty 2
  Filled 2019-12-28: qty 4
  Filled 2019-12-28: qty 2

## 2019-12-28 MED ORDER — CEFAZOLIN SODIUM-DEXTROSE 2-4 GM/100ML-% IV SOLN
2.0000 g | Freq: Four times a day (QID) | INTRAVENOUS | Status: AC
  Administered 2019-12-28: 2 g via INTRAVENOUS
  Filled 2019-12-28: qty 100

## 2019-12-28 MED ORDER — BUPIVACAINE HCL (PF) 0.5 % IJ SOLN
INTRAMUSCULAR | Status: DC | PRN
  Administered 2019-12-28: 3 mL

## 2019-12-28 MED ORDER — MAGNESIUM HYDROXIDE 400 MG/5ML PO SUSP
30.0000 mL | Freq: Every day | ORAL | Status: DC
  Administered 2019-12-29: 30 mL via ORAL
  Filled 2019-12-28: qty 30

## 2019-12-28 MED ORDER — PROPOFOL 500 MG/50ML IV EMUL
INTRAVENOUS | Status: DC | PRN
  Administered 2019-12-28: 50 ug/kg/min via INTRAVENOUS

## 2019-12-28 MED ORDER — TRANEXAMIC ACID-NACL 1000-0.7 MG/100ML-% IV SOLN
INTRAVENOUS | Status: AC
  Filled 2019-12-28: qty 100

## 2019-12-28 MED ORDER — BUPIVACAINE HCL (PF) 0.25 % IJ SOLN
INTRAMUSCULAR | Status: AC
  Filled 2019-12-28: qty 60

## 2019-12-28 MED ORDER — DIPHENHYDRAMINE HCL 12.5 MG/5ML PO ELIX: 12.5000 mg | ORAL_SOLUTION | ORAL | Status: DC | PRN

## 2019-12-28 MED ORDER — LOSARTAN POTASSIUM 50 MG PO TABS
75.0000 mg | ORAL_TABLET | Freq: Every day | ORAL | Status: DC
  Administered 2019-12-29: 75 mg via ORAL
  Filled 2019-12-28: qty 1

## 2019-12-28 MED ORDER — CEFAZOLIN SODIUM-DEXTROSE 2-4 GM/100ML-% IV SOLN
2.0000 g | INTRAVENOUS | Status: AC
  Administered 2019-12-28: 2 g via INTRAVENOUS

## 2019-12-28 MED ORDER — ONDANSETRON HCL 4 MG/2ML IJ SOLN: 4.0000 mg | Freq: Four times a day (QID) | INTRAMUSCULAR | Status: DC | PRN

## 2019-12-28 MED ORDER — CEFAZOLIN SODIUM-DEXTROSE 2-4 GM/100ML-% IV SOLN
INTRAVENOUS | Status: AC
  Filled 2019-12-28: qty 100

## 2019-12-28 MED ORDER — MENTHOL 3 MG MT LOZG
1.0000 | LOZENGE | OROMUCOSAL | Status: DC | PRN
  Filled 2019-12-28: qty 9

## 2019-12-28 MED ORDER — ORAL CARE MOUTH RINSE: 15.0000 mL | Freq: Once | OROMUCOSAL | Status: AC

## 2019-12-28 MED ORDER — OMEGA-3-ACID ETHYL ESTERS 1 G PO CAPS
1.0000 g | ORAL_CAPSULE | Freq: Every day | ORAL | Status: DC
  Administered 2019-12-29: 1 g via ORAL
  Filled 2019-12-28: qty 1

## 2019-12-28 MED ORDER — CELECOXIB 200 MG PO CAPS: 400.0000 mg | ORAL_CAPSULE | Freq: Once | ORAL | Status: AC

## 2019-12-28 MED ORDER — ALUM & MAG HYDROXIDE-SIMETH 200-200-20 MG/5ML PO SUSP: 30.0000 mL | ORAL | Status: DC | PRN

## 2019-12-28 MED ORDER — METOCLOPRAMIDE HCL 10 MG PO TABS
10.0000 mg | ORAL_TABLET | Freq: Three times a day (TID) | ORAL | Status: DC
  Administered 2019-12-28 – 2019-12-29 (×5): 10 mg via ORAL
  Filled 2019-12-28 (×5): qty 1

## 2019-12-28 MED ORDER — FENTANYL CITRATE (PF) 100 MCG/2ML IJ SOLN
25.0000 ug | INTRAMUSCULAR | Status: DC | PRN
  Administered 2019-12-28: 25 ug via INTRAVENOUS

## 2019-12-28 MED ORDER — PAROXETINE HCL 20 MG PO TABS
20.0000 mg | ORAL_TABLET | Freq: Every day | ORAL | Status: DC
  Administered 2019-12-29: 20 mg via ORAL
  Filled 2019-12-28: qty 1

## 2019-12-28 MED ORDER — MIDAZOLAM HCL 5 MG/5ML IJ SOLN
INTRAMUSCULAR | Status: DC | PRN
  Administered 2019-12-28 (×2): 1 mg via INTRAVENOUS

## 2019-12-28 MED ORDER — DEXAMETHASONE SODIUM PHOSPHATE 10 MG/ML IJ SOLN
INTRAMUSCULAR | Status: AC
  Administered 2019-12-28: 8 mg via INTRAVENOUS
  Filled 2019-12-28: qty 1

## 2019-12-28 MED ORDER — CHLORHEXIDINE GLUCONATE 0.12 % MT SOLN: 15.0000 mL | Freq: Once | OROMUCOSAL | Status: AC

## 2019-12-28 MED ORDER — SODIUM CHLORIDE 0.9 % IV SOLN
INTRAVENOUS | Status: DC | PRN
  Administered 2019-12-28: 60 mL

## 2019-12-28 MED ORDER — CELECOXIB 200 MG PO CAPS
200.0000 mg | ORAL_CAPSULE | Freq: Two times a day (BID) | ORAL | Status: DC
  Administered 2019-12-28 – 2019-12-29 (×2): 200 mg via ORAL
  Filled 2019-12-28 (×2): qty 1

## 2019-12-28 MED ORDER — DEXAMETHASONE SODIUM PHOSPHATE 10 MG/ML IJ SOLN: 8.0000 mg | Freq: Once | INTRAMUSCULAR | Status: AC

## 2019-12-28 MED ORDER — CHLORHEXIDINE GLUCONATE 4 % EX LIQD
60.0000 mL | Freq: Once | CUTANEOUS | Status: AC
  Administered 2019-12-28: 4 via TOPICAL

## 2019-12-28 MED ORDER — SODIUM CHLORIDE 0.9 % IV SOLN: INTRAVENOUS | Status: DC

## 2019-12-28 MED ORDER — ONDANSETRON HCL 4 MG/2ML IJ SOLN
INTRAMUSCULAR | Status: DC | PRN
  Administered 2019-12-28: 4 mg via INTRAVENOUS

## 2019-12-28 MED ORDER — BUPIVACAINE LIPOSOME 1.3 % IJ SUSP
INTRAMUSCULAR | Status: AC
  Filled 2019-12-28: qty 20

## 2019-12-28 MED ORDER — CELECOXIB 200 MG PO CAPS
ORAL_CAPSULE | ORAL | Status: AC
  Filled 2019-12-28: qty 1

## 2019-12-28 MED ORDER — LACTATED RINGERS IV SOLN: INTRAVENOUS | Status: DC

## 2019-12-28 MED ORDER — TRAMADOL HCL 50 MG PO TABS
ORAL_TABLET | ORAL | Status: AC
  Filled 2019-12-28: qty 1

## 2019-12-28 MED ORDER — TRANEXAMIC ACID-NACL 1000-0.7 MG/100ML-% IV SOLN
1000.0000 mg | INTRAVENOUS | Status: AC
  Administered 2019-12-28: 1000 mg via INTRAVENOUS

## 2019-12-28 MED ORDER — PHENOL 1.4 % MT LIQD
1.0000 | OROMUCOSAL | Status: DC | PRN
  Filled 2019-12-28: qty 177

## 2019-12-28 MED ORDER — VITAMIN B-12 1000 MCG PO TABS
1000.0000 ug | ORAL_TABLET | Freq: Every day | ORAL | Status: DC
  Administered 2019-12-29: 1000 ug via ORAL
  Filled 2019-12-28: qty 1

## 2019-12-28 MED ORDER — NEOMYCIN-POLYMYXIN B GU 40-200000 IR SOLN
Status: AC
  Filled 2019-12-28: qty 2

## 2019-12-28 MED ORDER — TRANEXAMIC ACID-NACL 1000-0.7 MG/100ML-% IV SOLN: 1000.0000 mg | Freq: Once | INTRAVENOUS | Status: AC

## 2019-12-28 MED ORDER — ACETAMINOPHEN 10 MG/ML IV SOLN
INTRAVENOUS | Status: AC
  Filled 2019-12-28: qty 100

## 2019-12-28 MED ORDER — BISACODYL 10 MG RE SUPP: 10.0000 mg | Freq: Every day | RECTAL | Status: DC | PRN

## 2019-12-28 MED ORDER — ONDANSETRON HCL 4 MG PO TABS: 4.0000 mg | ORAL_TABLET | Freq: Four times a day (QID) | ORAL | Status: DC | PRN

## 2019-12-28 MED ORDER — HYDROCHLOROTHIAZIDE 25 MG PO TABS
25.0000 mg | ORAL_TABLET | Freq: Every day | ORAL | Status: DC
  Administered 2019-12-29: 25 mg via ORAL
  Filled 2019-12-28: qty 1

## 2019-12-28 MED ORDER — FERROUS SULFATE 325 (65 FE) MG PO TABS
325.0000 mg | ORAL_TABLET | Freq: Two times a day (BID) | ORAL | Status: DC
  Administered 2019-12-28 – 2019-12-29 (×3): 325 mg via ORAL
  Filled 2019-12-28 (×3): qty 1

## 2019-12-28 MED ORDER — GABAPENTIN 300 MG PO CAPS: 300.0000 mg | ORAL_CAPSULE | Freq: Once | ORAL | Status: AC

## 2019-12-28 MED ORDER — BUPIVACAINE HCL (PF) 0.25 % IJ SOLN
INTRAMUSCULAR | Status: DC | PRN
  Administered 2019-12-28: 60 mL

## 2019-12-28 MED ORDER — NAPHAZOLINE-GLYCERIN 0.012-0.2 % OP SOLN
1.0000 [drp] | Freq: Four times a day (QID) | OPHTHALMIC | Status: DC | PRN
  Filled 2019-12-28: qty 15

## 2019-12-28 SURGICAL SUPPLY — 78 items
ATTUNE MED DOME PAT 38 KNEE (Knees) ×1 IMPLANT
ATTUNE MED DOME PAT 38MM KNEE (Knees) ×1 IMPLANT
ATTUNE PS FEM LT SZ 6 CEM KNEE (Femur) ×2 IMPLANT
ATTUNE PSRP INSR SZ6 10 KNEE (Insert) ×1 IMPLANT
ATTUNE PSRP INSR SZ6 10MM KNEE (Insert) ×1 IMPLANT
BASE TIBIA ATTUNE KNEE SYS SZ6 (Knees) IMPLANT
BATTERY INSTRU NAVIGATION (MISCELLANEOUS) ×12 IMPLANT
BLADE SAW 70X12.5 (BLADE) ×3 IMPLANT
BLADE SAW 90X13X1.19 OSCILLAT (BLADE) ×3 IMPLANT
BLADE SAW 90X25X1.19 OSCILLAT (BLADE) ×3 IMPLANT
CANISTER PREVENA PLUS 150 (CANNISTER) ×3 IMPLANT
CANISTER SUCT 3000ML PPV (MISCELLANEOUS) ×3 IMPLANT
CEMENT HV SMART SET (Cement) ×2 IMPLANT
COOLER POLAR GLACIER W/PUMP (MISCELLANEOUS) ×3 IMPLANT
COVER WAND RF STERILE (DRAPES) ×3 IMPLANT
CUFF TOURN SGL QUICK 24 (TOURNIQUET CUFF)
CUFF TOURN SGL QUICK 30 (TOURNIQUET CUFF)
CUFF TRNQT CYL 24X4X16.5-23 (TOURNIQUET CUFF) IMPLANT
CUFF TRNQT CYL 30X4X21-28X (TOURNIQUET CUFF) IMPLANT
DRAPE 3/4 80X56 (DRAPES) ×3 IMPLANT
DRSG DERMACEA 8X12 NADH (GAUZE/BANDAGES/DRESSINGS) ×3 IMPLANT
DRSG MEPILEX SACRM 8.7X9.8 (GAUZE/BANDAGES/DRESSINGS) ×3 IMPLANT
DRSG OPSITE POSTOP 4X14 (GAUZE/BANDAGES/DRESSINGS) ×3 IMPLANT
DRSG TEGADERM 4X4.75 (GAUZE/BANDAGES/DRESSINGS) ×3 IMPLANT
DURAPREP 26ML APPLICATOR (WOUND CARE) ×6 IMPLANT
ELECT REM PT RETURN 9FT ADLT (ELECTROSURGICAL) ×3
ELECTRODE REM PT RTRN 9FT ADLT (ELECTROSURGICAL) ×1 IMPLANT
EX-PIN ORTHOLOCK NAV 4X150 (PIN) ×6 IMPLANT
GLOVE BIO SURGEON STRL SZ7.5 (GLOVE) ×6 IMPLANT
GLOVE BIOGEL M STRL SZ7.5 (GLOVE) ×6 IMPLANT
GLOVE BIOGEL PI IND STRL 7.5 (GLOVE) ×1 IMPLANT
GLOVE BIOGEL PI INDICATOR 7.5 (GLOVE) ×2
GLOVE INDICATOR 8.0 STRL GRN (GLOVE) ×3 IMPLANT
GOWN STRL REUS W/ TWL LRG LVL3 (GOWN DISPOSABLE) ×2 IMPLANT
GOWN STRL REUS W/ TWL XL LVL3 (GOWN DISPOSABLE) ×1 IMPLANT
GOWN STRL REUS W/TWL LRG LVL3 (GOWN DISPOSABLE) ×4
GOWN STRL REUS W/TWL XL LVL3 (GOWN DISPOSABLE) ×2
HEMOVAC 400CC 10FR (MISCELLANEOUS) ×3 IMPLANT
HOLDER FOLEY CATH W/STRAP (MISCELLANEOUS) ×3 IMPLANT
HOOD PEEL AWAY FLYTE STAYCOOL (MISCELLANEOUS) ×6 IMPLANT
IRRIGATION SURGIPHOR STRL (IV SOLUTION) ×3 IMPLANT
KIT PREVENA INCISION MGT20CM45 (CANNISTER) ×3 IMPLANT
KIT PUMP PREVENA PLUS 14DAY (MISCELLANEOUS) ×3 IMPLANT
KIT TURNOVER KIT A (KITS) ×3 IMPLANT
KNIFE SCULPS 14X20 (INSTRUMENTS) ×3 IMPLANT
LABEL OR SOLS (LABEL) ×3 IMPLANT
MANIFOLD NEPTUNE II (INSTRUMENTS) ×6 IMPLANT
NDL SAFETY ECLIPSE 18X1.5 (NEEDLE) ×1 IMPLANT
NDL SPNL 20GX3.5 QUINCKE YW (NEEDLE) ×2 IMPLANT
NEEDLE HYPO 18GX1.5 SHARP (NEEDLE) ×2
NEEDLE SPNL 20GX3.5 QUINCKE YW (NEEDLE) ×6 IMPLANT
NS IRRIG 500ML POUR BTL (IV SOLUTION) ×3 IMPLANT
PACK TOTAL KNEE (MISCELLANEOUS) ×3 IMPLANT
PAD WRAPON POLAR KNEE (MISCELLANEOUS) ×1 IMPLANT
PENCIL SMOKE ULTRAEVAC 22 CON (MISCELLANEOUS) ×3 IMPLANT
PIN FIXATION 1/8DIA X 3INL (PIN) ×9 IMPLANT
PULSAVAC PLUS IRRIG FAN TIP (DISPOSABLE) ×3
SOL .9 NS 3000ML IRR  AL (IV SOLUTION) ×2
SOL .9 NS 3000ML IRR UROMATIC (IV SOLUTION) ×1 IMPLANT
SOL PREP PVP 2OZ (MISCELLANEOUS) ×3
SOLUTION PREP PVP 2OZ (MISCELLANEOUS) ×1 IMPLANT
SPONGE DRAIN TRACH 4X4 STRL 2S (GAUZE/BANDAGES/DRESSINGS) ×3 IMPLANT
STAPLER SKIN PROX 35W (STAPLE) ×3 IMPLANT
STOCKINETTE IMPERV 14X48 (MISCELLANEOUS) IMPLANT
STRAP TIBIA SHORT (MISCELLANEOUS) ×3 IMPLANT
SUCTION FRAZIER HANDLE 10FR (MISCELLANEOUS) ×2
SUCTION TUBE FRAZIER 10FR DISP (MISCELLANEOUS) ×1 IMPLANT
SUT VIC AB 0 CT1 36 (SUTURE) ×6 IMPLANT
SUT VIC AB 1 CT1 36 (SUTURE) ×6 IMPLANT
SUT VIC AB 2-0 CT2 27 (SUTURE) ×3 IMPLANT
SYR 20ML LL LF (SYRINGE) ×3 IMPLANT
SYR 30ML LL (SYRINGE) ×6 IMPLANT
TIBIA ATTUNE KNEE SYS BASE SZ6 (Knees) ×3 IMPLANT
TIP FAN IRRIG PULSAVAC PLUS (DISPOSABLE) ×1 IMPLANT
TOWEL OR 17X26 4PK STRL BLUE (TOWEL DISPOSABLE) ×3 IMPLANT
TOWER CARTRIDGE SMART MIX (DISPOSABLE) ×3 IMPLANT
TRAY FOLEY MTR SLVR 16FR STAT (SET/KITS/TRAYS/PACK) ×3 IMPLANT
WRAPON POLAR PAD KNEE (MISCELLANEOUS) ×3

## 2019-12-28 NOTE — Progress Notes (Signed)
Patient spinal progressing well. Remains alert and oriented. Drinking, offered food but declined at this time. No needs. Waiting on admit bed.

## 2019-12-28 NOTE — Anesthesia Procedure Notes (Signed)
Date/Time: 12/28/2019 7:45 AM Performed by: Junious Silk, CRNA Pre-anesthesia Checklist: Patient identified, Emergency Drugs available, Suction available, Patient being monitored and Timeout performed Oxygen Delivery Method: Simple face mask

## 2019-12-28 NOTE — Op Note (Signed)
OPERATIVE NOTE  DATE OF SURGERY:  12/28/2019  PATIENT NAME:  Jesus White   DOB: Sep 25, 1945  MRN: [id number]  PRE-OPERATIVE DIAGNOSIS: Degenerative arthrosis of the left knee, primary  POST-OPERATIVE DIAGNOSIS:  Same  PROCEDURE:  Left total knee arthroplasty using computer-assisted navigation  SURGEON:  Jena Gauss. M.D.  ASSISTANT: Baldwin Jamaica, PA-C (present and scrubbed throughout the case, critical for assistance with exposure, retraction, instrumentation, and closure)  ANESTHESIA: spinal  ESTIMATED BLOOD LOSS: 50 mL  FLUIDS REPLACED: 1100 mL of crystalloid  TOURNIQUET TIME: 82 minutes  DRAINS: 2 medium Hemovac  SOFT TISSUE RELEASES: Anterior cruciate ligament, posterior cruciate ligament, deep medial collateral ligament, patellofemoral ligament  IMPLANTS UTILIZED: DePuy Attune size 6 posterior stabilized femoral component (cemented), size 6 rotating platform tibial component (cemented), 38 mm medialized dome patella (cemented), and a 10 mm stabilized rotating platform polyethylene insert.  INDICATIONS FOR SURGERY: Jesus White is a 74 y.o. year old male with a long history of progressive knee pain. X-rays demonstrated severe degenerative changes in tricompartmental fashion. The patient had not seen any significant improvement despite conservative nonsurgical intervention. After discussion of the risks and benefits of surgical intervention, the patient expressed understanding of the risks benefits and agree with plans for total knee arthroplasty.   The risks, benefits, and alternatives were discussed at length including but not limited to the risks of infection, bleeding, nerve injury, stiffness, blood clots, the need for revision surgery, cardiopulmonary complications, among others, and they were willing to proceed.  PROCEDURE IN DETAIL: The patient was brought into the operating room and, after adequate spinal anesthesia was achieved, a tourniquet was placed on the  patient's upper thigh. The patient's knee and leg were cleaned and prepped with alcohol and DuraPrep and draped in the usual sterile fashion. A "timeout" was performed as per usual protocol. The lower extremity was exsanguinated using an Esmarch, and the tourniquet was inflated to 300 mmHg. An anterior longitudinal incision was made followed by a standard mid vastus approach. The deep fibers of the medial collateral ligament were elevated in a subperiosteal fashion off of the medial flare of the tibia so as to maintain a continuous soft tissue sleeve. The patella was subluxed laterally and the patellofemoral ligament was incised. Inspection of the knee demonstrated severe degenerative changes with full-thickness loss of articular cartilage. Osteophytes were debrided using a rongeur. Anterior and posterior cruciate ligaments were excised. Two 4.0 mm Schanz pins were inserted in the femur and into the tibia for attachment of the array of trackers used for computer-assisted navigation. Hip center was identified using a circumduction technique. Distal landmarks were mapped using the computer. The distal femur and proximal tibia were mapped using the computer. The distal femoral cutting guide was positioned using computer-assisted navigation so as to achieve a 5 distal valgus cut. The femur was sized and it was felt that a size 6 femoral component was appropriate. A size 6 femoral cutting guide was positioned and the anterior cut was performed and verified using the computer. This was followed by completion of the posterior and chamfer cuts. Femoral cutting guide for the central box was then positioned in the center box cut was performed.  Attention was then directed to the proximal tibia. Medial and lateral menisci were excised. The extramedullary tibial cutting guide was positioned using computer-assisted navigation so as to achieve a 0 varus-valgus alignment and 3 posterior slope. The cut was performed and  verified using the computer. The proximal tibia was sized  and it was felt that a size 6 tibial tray was appropriate. Tibial and femoral trials were inserted followed by insertion of a 10 mm polyethylene insert. This allowed for excellent mediolateral soft tissue balancing both in flexion and in full extension. Finally, the patella was cut and prepared so as to accommodate a 38 mm medialized dome patella. A patella trial was placed and the knee was placed through a range of motion with excellent patellar tracking appreciated. The femoral trial was removed after debridement of posterior osteophytes. The central post-hole for the tibial component was reamed followed by insertion of a keel punch. Tibial trials were then removed. Cut surfaces of bone were irrigated with copious amounts of normal saline using pulsatile lavage and then suctioned dry. Polymethylmethacrylate cement was prepared in the usual fashion using a vacuum mixer. Cement was applied to the cut surface of the proximal tibia as well as along the undersurface of a size 6 rotating platform tibial component. Tibial component was positioned and impacted into place. Excess cement was removed using Personal assistant. Cement was then applied to the cut surfaces of the femur as well as along the posterior flanges of the size 6 femoral component. The femoral component was positioned and impacted into place. Excess cement was removed using Personal assistant. A 10 mm polyethylene trial was inserted and the knee was brought into full extension with steady axial compression applied. Finally, cement was applied to the backside of a 38 mm medialized dome patella and the patellar component was positioned and patellar clamp applied. Excess cement was removed using Personal assistant. After adequate curing of the cement, the tourniquet was deflated after a total tourniquet time of 82 minutes. Hemostasis was achieved using electrocautery. The knee was irrigated with copious  amounts of normal saline using pulsatile lavage followed by 500 ml of Surgiphor and then suctioned dry. 20 mL of 1.3% Exparel and 60 mL of 0.25% Marcaine in 40 mL of normal saline was injected along the posterior capsule, medial and lateral gutters, and along the arthrotomy site. A 10 mm stabilized rotating platform polyethylene insert was inserted and the knee was placed through a range of motion with excellent mediolateral soft tissue balancing appreciated and excellent patellar tracking noted. 2 medium drains were placed in the wound bed and brought out through separate stab incisions. The medial parapatellar portion of the incision was reapproximated using interrupted sutures of #1 Vicryl. Subcutaneous tissue was approximated in layers using first #0 Vicryl followed #2-0 Vicryl. The skin was approximated with skin staples. A sterile dressing was applied.  The patient tolerated the procedure well and was transported to the recovery room in stable condition.    Leonidas Boateng P. Angie Fava., M.D.

## 2019-12-28 NOTE — Anesthesia Preprocedure Evaluation (Signed)
Anesthesia Evaluation  Patient identified by MRN, date of birth, ID band Patient awake    Reviewed: Allergy & Precautions, NPO status , Patient's Chart, lab work & pertinent test results  History of Anesthesia Complications Negative for: history of anesthetic complications  Airway Mallampati: II  TM Distance: >3 FB Neck ROM: Full    Dental no notable dental hx.    Pulmonary neg pulmonary ROS, neg sleep apnea, neg COPD,    breath sounds clear to auscultation- rhonchi (-) wheezing      Cardiovascular hypertension, Pt. on medications (-) Past MI, (-) Cardiac Stents and (-) CABG  Rhythm:Regular Rate:Normal - Systolic murmurs and - Diastolic murmurs    Neuro/Psych neg Seizures Anxiety negative neurological ROS     GI/Hepatic Neg liver ROS, GERD  ,  Endo/Other  diabetes  Renal/GU negative Renal ROS  negative genitourinary   Musculoskeletal  (+) Arthritis , Osteoarthritis,    Abdominal (+) - obese,   Peds negative pediatric ROS (+)  Hematology negative hematology ROS (+)   Anesthesia Other Findings Past Medical History: No date: Anxiety No date: Arthritis No date: Atherosclerosis No date: Cholelithiasis with choledocholithiasis No date: Diabetes mellitus without complication (HCC) No date: Diverticulosis No date: Elevated lipids No date: GERD (gastroesophageal reflux disease) No date: Hyperlipemia No date: Hypertension No date: Liver lesion No date: Sepsis (HCC)   Reproductive/Obstetrics                             Lab Results  Component Value Date   WBC 4.2 12/21/2019   HGB 14.4 12/21/2019   HCT 42.0 12/21/2019   MCV 91.9 12/21/2019   PLT 193 12/21/2019    Anesthesia Physical  Anesthesia Plan  ASA: III  Anesthesia Plan: Spinal   Post-op Pain Management:    Induction:   PONV Risk Score and Plan: 1 and Ondansetron and Propofol infusion  Airway Management Planned:  Natural Airway and Nasal Cannula  Additional Equipment:   Intra-op Plan:   Post-operative Plan:   Informed Consent: I have reviewed the patients History and Physical, chart, labs and discussed the procedure including the risks, benefits and alternatives for the proposed anesthesia with the patient or authorized representative who has indicated his/her understanding and acceptance.     Dental advisory given  Plan Discussed with: CRNA and Anesthesiologist  Anesthesia Plan Comments:         Anesthesia Quick Evaluation

## 2019-12-28 NOTE — Evaluation (Signed)
Physical Therapy Evaluation Patient Details Name: Jesus White MRN: [id number] DOB: 1945/10/03 Today's Date: 12/28/2019   History of Present Illness  Pt is admitted for L TKR. History or R TKR 1 year ago, anxiety, GERD, and HTN.  Clinical Impression  Pt is a pleasant 74 year old male who was admitted for L TKR. Pt performs bed mobility, transfers, and ambulation with cga and RW. Pt demonstrates deficits with strength/mobility/ROM. Pt demonstrates ability to perform 10 SLRs with independence, therefore does not require KI for mobility. Pt is very motivated to perform therapy. Would benefit from skilled PT to address above deficits and promote optimal return to PLOF. Recommend transition to HHPT upon discharge from acute hospitalization.     Follow Up Recommendations Home health PT    Equipment Recommendations  None recommended by PT    Recommendations for Other Services       Precautions / Restrictions Precautions Precautions: Fall;Knee Precaution Booklet Issued: No Restrictions Weight Bearing Restrictions: Yes LLE Weight Bearing: Weight bearing as tolerated      Mobility  Bed Mobility Overal bed mobility: Needs Assistance Bed Mobility: Supine to Sit     Supine to sit: Min guard     General bed mobility comments: safe technique with upright posture    Transfers Overall transfer level: Needs assistance Equipment used: Rolling walker (2 wheeled) Transfers: Sit to/from Stand Sit to Stand: Min guard         General transfer comment: struggles from low surface. Good weight acceptance once standing  Ambulation/Gait Ambulation/Gait assistance: Min guard Gait Distance (Feet): 5 Feet Assistive device: Rolling walker (2 wheeled) Gait Pattern/deviations: Step-to pattern     General Gait Details: ambulated over to recliner with safe technique using step to gait pattern. Upright posture and limited pain  Stairs            Wheelchair Mobility    Modified  Rankin (Stroke Patients Only)       Balance Overall balance assessment: Needs assistance Sitting-balance support: Feet supported Sitting balance-Leahy Scale: Normal     Standing balance support: Bilateral upper extremity supported Standing balance-Leahy Scale: Good                               Pertinent Vitals/Pain Pain Assessment: 0-10 Pain Score: 1  Pain Location: L knee Pain Descriptors / Indicators: Operative site guarding Pain Intervention(s): Limited activity within patient's tolerance;Patient requesting pain meds-RN notified;Ice applied    Home Living Family/patient expects to be discharged to:: Private residence Living Arrangements: Spouse/significant other Available Help at Discharge: Family;Available 24 hours/day Type of Home: House Home Access: Stairs to enter Entrance Stairs-Rails: None Entrance Stairs-Number of Steps: 3 Home Layout: One level Home Equipment: Walker - 2 wheels;Cane - single point;Bedside commode      Prior Function Level of Independence: Independent               Hand Dominance        Extremity/Trunk Assessment   Upper Extremity Assessment Upper Extremity Assessment: Overall WFL for tasks assessed    Lower Extremity Assessment Lower Extremity Assessment: Generalized weakness (L LE grossly 3+/5; R LE grossly 5/5)       Communication   Communication: No difficulties  Cognition Arousal/Alertness: Awake/alert Behavior During Therapy: WFL for tasks assessed/performed Overall Cognitive Status: Within Functional Limits for tasks assessed  General Comments      Exercises Total Joint Exercises Goniometric ROM: L knee AAROM: 1-67 degrees Other Exercises Other Exercises: supine ther-ex performed on L LE including AP, quad sets, SLRs, and hip abd/add. All ther0ex performed x 10 reps with safe technique   Assessment/Plan    PT Assessment Patient needs  continued PT services  PT Problem List Decreased strength;Decreased range of motion;Decreased mobility;Pain       PT Treatment Interventions DME instruction;Gait training;Stair training;Therapeutic exercise    PT Goals (Current goals can be found in the Care Plan section)  Acute Rehab PT Goals Patient Stated Goal: to go home PT Goal Formulation: With patient Time For Goal Achievement: 01/11/20 Potential to Achieve Goals: Good    Frequency BID   Barriers to discharge        Co-evaluation               AM-PAC PT "6 Clicks" Mobility  Outcome Measure Help needed turning from your back to your side while in a flat bed without using bedrails?: None Help needed moving from lying on your back to sitting on the side of a flat bed without using bedrails?: A Little Help needed moving to and from a bed to a chair (including a wheelchair)?: A Little Help needed standing up from a chair using your arms (e.g., wheelchair or bedside chair)?: A Little Help needed to walk in hospital room?: A Little Help needed climbing 3-5 steps with a railing? : A Little 6 Click Score: 19    End of Session Equipment Utilized During Treatment: Gait belt Activity Tolerance: Patient tolerated treatment well Patient left: in chair;with chair alarm set;with SCD's reapplied Nurse Communication: Mobility status PT Visit Diagnosis: Muscle weakness (generalized) (M62.81);Difficulty in walking, not elsewhere classified (R26.2);Pain Pain - Right/Left: Left Pain - part of body: Knee    Time: 1510-1600 PT Time Calculation (min) (ACUTE ONLY): 50 min   Charges:   PT Evaluation $PT Eval Low Complexity: 1 Low PT Treatments $Therapeutic Exercise: 23-37 mins        Elizabeth Palau, PT, DPT [phone]   Pankaj Haack 12/28/2019, 4:51 PM

## 2019-12-28 NOTE — Transfer of Care (Signed)
Immediate Anesthesia Transfer of Care Note  Patient: Jesus White  Procedure(s) Performed: COMPUTER ASSISTED TOTAL KNEE ARTHROPLASTY (Left Knee)  Patient Location: PACU  Anesthesia Type:Spinal  Level of Consciousness: awake, alert  and oriented  Airway & Oxygen Therapy: Patient Spontanous Breathing and Patient connected to face mask oxygen  Post-op Assessment: Report given to RN and Post -op Vital signs reviewed and stable  Post vital signs: Reviewed and stable  Last Vitals:  Vitals Value Taken Time  BP 119/66 12/28/19 1041  Temp 36 C 12/28/19 1041  Pulse 60 12/28/19 1045  Resp 17 12/28/19 1045  SpO2 98 % 12/28/19 1045  Vitals shown include unvalidated device data.  Last Pain:  Vitals:   12/28/19 1041  TempSrc:   PainSc: (P) Asleep         Complications: No complications documented.

## 2019-12-28 NOTE — H&P (Signed)
The patient has been re-examined, and the chart reviewed, and there have been no interval changes to the documented history and physical.    The risks, benefits, and alternatives have been discussed at length. The patient expressed understanding of the risks benefits and agreed with plans for surgical intervention.  Alexander Mcauley P. Youssouf Shipley, Jr. M.D.    

## 2019-12-28 NOTE — Anesthesia Procedure Notes (Signed)
Spinal  Patient location during procedure: OR Start time: 12/28/2019 7:18 AM End time: 12/28/2019 7:23 AM Staffing Performed: resident/CRNA  Resident/CRNA: Nelda Marseille, CRNA Preanesthetic Checklist Completed: patient identified, IV checked, site marked, risks and benefits discussed, surgical consent, monitors and equipment checked, pre-op evaluation and timeout performed Spinal Block Patient position: sitting Prep: Betadine Patient monitoring: heart rate, continuous pulse ox, blood pressure and cardiac monitor Approach: midline Location: L3-4 Injection technique: single-shot Needle Needle type: Whitacre and Introducer  Needle gauge: 25 G Needle length: 9 cm Assessment Sensory level: T10 Additional Notes Negative paresthesia. Negative blood return. Positive free-flowing CSF. Expiration date of kit checked and confirmed. Patient tolerated procedure well, without complications.

## 2019-12-29 ENCOUNTER — Encounter: Payer: Self-pay | Admitting: Orthopedic Surgery

## 2019-12-29 MED ORDER — OXYCODONE HCL 5 MG PO TABS: 5.0000 mg | ORAL_TABLET | ORAL | 0 refills | Status: DC | PRN

## 2019-12-29 MED ORDER — TRAMADOL HCL 50 MG PO TABS: 50.0000 mg | ORAL_TABLET | ORAL | 0 refills | Status: DC | PRN

## 2019-12-29 MED ORDER — ENOXAPARIN SODIUM 40 MG/0.4ML ~~LOC~~ SOLN: 40.0000 mg | SUBCUTANEOUS | 0 refills | Status: DC

## 2019-12-29 MED ORDER — CELECOXIB 200 MG PO CAPS: 200.0000 mg | ORAL_CAPSULE | Freq: Two times a day (BID) | ORAL | 0 refills | Status: DC

## 2019-12-29 NOTE — Progress Notes (Signed)
Pt discharged to home. IV removed without complication.  AVS given to pt and explained with no further questions.  All belongings at bedside taken with pt.    

## 2019-12-29 NOTE — Progress Notes (Signed)
ORTHOPAEDICS PROGRESS NOTE  PATIENT NAME: Jesus White DOB: [birth year]  MRN: [id number]  POD # 1: Left total knee arthroplasty  Subjective: The patient rested well last night.  Sleeping soundly this morning.  Easily arousable. Patient states the pain was under good control.  No nausea or vomiting. The patient tolerated initial physical therapy session well.  Objective: Vital signs in last 24 hours: Temp:  [96.8 F (36 C)-98 F (36.7 C)] 97.8 F (36.6 C) (12/09 0425) Pulse Rate:  [58-72] 66 (12/09 0425) Resp:  [10-17] 16 (12/08 1550) BP: (115-151)/(66-89) 126/66 (12/09 0425) SpO2:  [92 %-100 %] 96 % (12/09 0425)  Intake/Output from previous day: 12/08 0701 - 12/09 0700 In: 1420 [P.O.:120; I.V.:1000; IV Piggyback:300] Out: 905 [Urine:625; Drains:230; Blood:50]  No results for input(s): WBC, HGB, HCT, PLT, K, CL, CO2, BUN, CREATININE, GLUCOSE, CALCIUM, LABPT, INR in the last 72 hours.  EXAM General: Well-developed well-nourished male seen in no apparent discomfort. Lungs: clear to auscultation Cardiac: normal rate and regular rhythm Abdomen: Soft, nontender, nondistended.  Active bowel sounds are present. Left lower extremity: Dressing is dry and intact.  Polar Care and Hemovac are intact and functioning.  The patient is able to perform an independent straight leg raise.  Homans test is negative. Neurologic: Awake, alert, and oriented.  Assessment: Status post left total knee arthroplasty  Secondary diagnoses: Hypertension Hyperlipidemia Gastroesophageal reflux disease Anxiety Status post right total knee arthroplasty  Plan: Notes from physical therapy were reviewed. Today's goals were reviewed with the patient.  Continue with physical therapy and Occupational Therapy as per total knee arthroplasty rehab protocol. Plan is to go Home after hospital stay. DVT Prophylaxis - Lovenox, Foot Pumps and TED hose  Zykeriah Mathia P. Angie Fava M.D.

## 2019-12-29 NOTE — Anesthesia Postprocedure Evaluation (Signed)
Anesthesia Post Note  Patient: Jesus White  Procedure(s) Performed: COMPUTER ASSISTED TOTAL KNEE ARTHROPLASTY (Left Knee)  Patient location during evaluation: Nursing Unit Anesthesia Type: Spinal Level of consciousness: awake, awake and alert, oriented and patient cooperative Pain management: pain level controlled Vital Signs Assessment: post-procedure vital signs reviewed and stable Respiratory status: spontaneous breathing, nonlabored ventilation and respiratory function stable Cardiovascular status: stable Postop Assessment: no headache, no backache, no apparent nausea or vomiting, patient able to bend at knees and adequate PO intake Anesthetic complications: no   No complications documented.   Last Vitals:  Vitals:   12/28/19 2344 12/29/19 0425  BP: (!) 151/66 126/66  Pulse: 64 66  Resp:    Temp:  36.6 C  SpO2: 99% 96%    Last Pain:  Vitals:   12/29/19 0425  TempSrc: Oral  PainSc:                  Lyn Records

## 2019-12-29 NOTE — TOC Initial Note (Signed)
Transition of Care [facility]) - Initial/Assessment Note    Patient Details  Name: Jesus White MRN: [id number] Date of Birth: [birth year]  Transition of [facility]) CM/SW Contact:    Shelbie Ammons, RN Phone Number: 12/29/2019, 2:07 PM  Clinical Narrative:    RNCM met with patient in room. Patient sitting up in chair on arrival, reports to feeling well, watching tv and is ready to go home. Patient reports that he has all necessary equipment at home and that he has already been called by [facility] to set up a time to come out tomorrow, he denies any other needs at this time.  RNCM reached out to [place] with [facility] and they are ready to service patient.                Expected Discharge Plan: [place] Barriers to Discharge: No Barriers Identified   Patient Goals and CMS Choice        Expected Discharge Plan and Services Expected Discharge Plan: [place]       Living arrangements for the past 2 months: Single Family Home                           HH Arranged: PT,OT [place] Agency: [facility] (formerly [employer]) Date [place]: 12/29/19 Time [place]: 69 Representative spoke with at [place]: [place] Arrangements/Services Living arrangements for the past 2 months: [place] Lives with:: Spouse Patient language and need for interpreter reviewed:: Yes Do you feel safe going back to the place where you live?: Yes      Need for Family Participation in Patient Care: Yes (Comment) Care giver support system in place?: Yes (comment)   Criminal Activity/Legal Involvement Pertinent to Current Situation/Hospitalization: No - Comment as needed  Activities of Daily Living Home Assistive Devices/Equipment: Dentures (specify type) ([place]) ADL Screening (condition at time of admission) Patient's cognitive ability adequate to safely complete daily activities?: Yes Is the patient  deaf or have difficulty hearing?: No Does the patient have difficulty seeing, even when wearing glasses/contacts?: No Does the patient have difficulty concentrating, remembering, or making decisions?: No Patient able to express need for assistance with ADLs?: Yes Does the patient have difficulty dressing or bathing?: No Independently performs ADLs?: Yes (appropriate for developmental age) Does the patient have difficulty walking or climbing stairs?: No Weakness of Legs: None Weakness of Arms/Hands: None  Permission Sought/Granted                  Emotional Assessment Appearance:: Appears stated age Attitude/Demeanor/Rapport: Engaged   Orientation: : Oriented to Self,Oriented to Place,Oriented to  Time,Oriented to Situation Alcohol / Substance Use: Not Applicable Psych Involvement: No (comment)  Admission diagnosis:  Total knee replacement status [Z96.659] Patient Active Problem List   Diagnosis Date Noted  . Total knee replacement status 12/28/2019  . Anxiety 09/06/2018  . GERD without esophagitis 09/06/2018  . Pure hypercholesterolemia 09/06/2018  . Status post total right knee replacement 09/06/2018  . Borderline diabetes mellitus 02/06/2017  . Sepsis ([place]) 11/04/2014  . Cholelithiasis with choledocholithiasis 11/04/2014  . Elevated transaminase level 11/04/2014  . Hypokalemia 11/04/2014  . Liver lesion 11/04/2014  . Coronary atherosclerosis 11/04/2014  . SIRS (systemic inflammatory response syndrome) ([place]) 11/04/2014  . Acute encephalopathy 11/04/2014  . Lactic acidosis 11/04/2014  . Hypertension   . Hyperlipemia  PCP:  Dion Body, MD Pharmacy:   [id number] [place], [place] [place] [address] Phone: [phone] Fax: [phone]     Social Determinants of Health (SDOH) Interventions    Readmission Risk Interventions No flowsheet data found.

## 2019-12-29 NOTE — Progress Notes (Signed)
Physical Therapy Treatment Patient Details Name: Jesus White MRN: [id number] DOB: September 03, 1945 Today's Date: 12/29/2019    History of Present Illness Pt is admitted for L TKR. History or R TKR 1 year ago, anxiety, GERD, and HTN.    PT Comments    Pt is making good progress towards goals. Written HEP given and reviewed. Good endurance with mobility efforts and ROM. Will plan to progress to stair training this PM.   Follow Up Recommendations  Home health PT     Equipment Recommendations  None recommended by PT    Recommendations for Other Services       Precautions / Restrictions Precautions Precautions: Fall;Knee Precaution Booklet Issued: Yes (comment) Restrictions Weight Bearing Restrictions: Yes LLE Weight Bearing: Weight bearing as tolerated    Mobility  Bed Mobility Overal bed mobility: Needs Assistance Bed Mobility: Supine to Sit     Supine to sit: Supervision     General bed mobility comments: safe technique with upright posture  Transfers Overall transfer level: Modified independent Equipment used: Rolling walker (2 wheeled) Transfers: Sit to/from Stand Sit to Stand: Modified independent (Device/Increase time)         General transfer comment: safe technique. Upright posture noted  Ambulation/Gait Ambulation/Gait assistance: Min guard Gait Distance (Feet): 200 Feet Assistive device: Rolling walker (2 wheeled) Gait Pattern/deviations: Step-through pattern Gait velocity: 10' in 6 seconds   General Gait Details: ambulated with good cadence in hallway. UPright posture   Stairs             Wheelchair Mobility    Modified Rankin (Stroke Patients Only)       Balance Overall balance assessment: Needs assistance Sitting-balance support: Feet supported Sitting balance-Leahy Scale: Normal     Standing balance support: Bilateral upper extremity supported Standing balance-Leahy Scale: Good                               Cognition Arousal/Alertness: Awake/alert Behavior During Therapy: WFL for tasks assessed/performed Overall Cognitive Status: Within Functional Limits for tasks assessed                                        Exercises Total Joint Exercises Goniometric ROM: L knee AAROM: 0-92 Other Exercises Other Exercises: supine ther-ex performed on L LE including AP, quad sets, SLRs, SAQ, heel slides, and hip abd/add. All ther0ex performed x 12 reps with safe technique. Written HEP given and reviewed    General Comments        Pertinent Vitals/Pain Pain Assessment: No/denies pain    Home Living                      Prior Function            PT Goals (current goals can now be found in the care plan section) Acute Rehab PT Goals Patient Stated Goal: to go home PT Goal Formulation: With patient Time For Goal Achievement: 01/11/20 Potential to Achieve Goals: Good Progress towards PT goals: Progressing toward goals    Frequency    BID      PT Plan Current plan remains appropriate    Co-evaluation              AM-PAC PT "6 Clicks" Mobility   Outcome Measure  Help needed turning from your back to your side  while in a flat bed without using bedrails?: None Help needed moving from lying on your back to sitting on the side of a flat bed without using bedrails?: None Help needed moving to and from a bed to a chair (including a wheelchair)?: None Help needed standing up from a chair using your arms (e.g., wheelchair or bedside chair)?: None Help needed to walk in hospital room?: None Help needed climbing 3-5 steps with a railing? : A Little 6 Click Score: 23    End of Session Equipment Utilized During Treatment: Gait belt Activity Tolerance: Patient tolerated treatment well Patient left: in chair;with chair alarm set;with SCD's reapplied Nurse Communication: Mobility status PT Visit Diagnosis: Muscle weakness (generalized) (M62.81);Difficulty in  walking, not elsewhere classified (R26.2);Pain Pain - Right/Left: Left Pain - part of body: Knee     Time: 1025-8527 PT Time Calculation (min) (ACUTE ONLY): 24 min  Charges:  $Gait Training: 8-22 mins $Therapeutic Exercise: 8-22 mins                     Elizabeth Palau, PT, DPT [phone]    Jesus White 12/29/2019, 10:08 AM

## 2019-12-29 NOTE — Progress Notes (Signed)
Physical Therapy Treatment Patient Details Name: Jesus White MRN: [id number] DOB: 04-07-1945 Today's Date: 12/29/2019    History of Present Illness Pt is admitted for L TKR. History or R TKR 1 year ago, anxiety, GERD, and HTN.    PT Comments    Pt is making great progress towards goals and is ready for dc this date. Care team made aware. PT goals completed. Good endurance with HEP and ambulation. Will continue to progress as able.  Follow Up Recommendations  Home health PT     Equipment Recommendations  None recommended by PT    Recommendations for Other Services       Precautions / Restrictions Precautions Precautions: Fall;Knee Precaution Booklet Issued: Yes (comment) Restrictions Weight Bearing Restrictions: Yes LLE Weight Bearing: Weight bearing as tolerated    Mobility  Bed Mobility               General bed mobility comments: deferred, up in recliner  Transfers Overall transfer level: Modified independent Equipment used: Rolling walker (2 wheeled) Transfers: Sit to/from Stand Sit to Stand: Modified independent (Device/Increase time)         General transfer comment: safe technique. Upright posture noted  Ambulation/Gait Ambulation/Gait assistance: Supervision Gait Distance (Feet): 250 Feet Assistive device: Rolling walker (2 wheeled) Gait Pattern/deviations: Step-through pattern Gait velocity: 10' in 6 seconds   General Gait Details: reciprocal gait pattern with symmetrical step length.   Stairs Stairs: Yes Stairs assistance: Supervision Stair Management: No rails;Step to pattern;Backwards Number of Stairs: 3 General stair comments: Pt performed stair training x 2 reps with safe technique. Verbal cues required for sequencing   Wheelchair Mobility    Modified Rankin (Stroke Patients Only)       Balance Overall balance assessment: Needs assistance Sitting-balance support: Feet supported Sitting balance-Leahy Scale: Normal      Standing balance support: Bilateral upper extremity supported Standing balance-Leahy Scale: Good                              Cognition Arousal/Alertness: Awake/alert Behavior During Therapy: WFL for tasks assessed/performed Overall Cognitive Status: Within Functional Limits for tasks assessed                                        Exercises Other Exercises Other Exercises: Seated ther-ex performed on L LE including LAQ and heel slides. All ther0ex performed x 12 reps with safe technique. Written HEP given and reviewed. Also stood at sink to brush teeth Other Exercises: Pt educated in falls prevention, home/routines modifications, compression stocking mgt, polar care mgt, AE/DME for ADL tasks; handout provided    General Comments        Pertinent Vitals/Pain Pain Assessment: Faces Faces Pain Scale: Hurts a little bit Pain Location: L knee Pain Descriptors / Indicators: Operative site guarding Pain Intervention(s): Limited activity within patient's tolerance;Repositioned;Ice applied    Home Living Family/patient expects to be discharged to:: Private residence Living Arrangements: Spouse/significant other Available Help at Discharge: Family;Available 24 hours/day Type of Home: House Home Access: Stairs to enter Entrance Stairs-Rails: None Home Layout: One level Home Equipment: Environmental consultant - 2 wheels;Cane - single point;Bedside commode      Prior Function Level of Independence: Independent          PT Goals (current goals can now be found in the care plan section) Acute Rehab  PT Goals Patient Stated Goal: to go home PT Goal Formulation: With patient Time For Goal Achievement: 01/11/20 Potential to Achieve Goals: Good Progress towards PT goals: Progressing toward goals    Frequency    BID      PT Plan Current plan remains appropriate    Co-evaluation              AM-PAC PT "6 Clicks" Mobility   Outcome Measure  Help needed  turning from your back to your side while in a flat bed without using bedrails?: None Help needed moving from lying on your back to sitting on the side of a flat bed without using bedrails?: None Help needed moving to and from a bed to a chair (including a wheelchair)?: None Help needed standing up from a chair using your arms (e.g., wheelchair or bedside chair)?: None Help needed to walk in hospital room?: None Help needed climbing 3-5 steps with a railing? : A Little 6 Click Score: 23    End of Session Equipment Utilized During Treatment: Gait belt Activity Tolerance: Patient tolerated treatment well Patient left: in chair;with chair alarm set Nurse Communication: Mobility status PT Visit Diagnosis: Muscle weakness (generalized) (M62.81);Difficulty in walking, not elsewhere classified (R26.2);Pain Pain - Right/Left: Left Pain - part of body: Knee     Time: 0867-6195 PT Time Calculation (min) (ACUTE ONLY): 23 min  Charges:  $Gait Training: 8-22 mins $Therapeutic Exercise: 8-22 mins                     Elizabeth Palau, PT, DPT [phone]    Jesus White 12/29/2019, 1:58 PM

## 2019-12-29 NOTE — Discharge Summary (Signed)
Physician Discharge Summary  Patient ID: Jesus White MRN: [id number] DOB/AGE: 1945-05-19 74 y.o.  Admit date: 12/28/2019 Discharge date: 12/29/2019  Admission Diagnoses:  Total knee replacement status [Z96.659]  Surgeries:Procedure(s): Left total knee arthroplasty using computer-assisted navigation  SURGEON:  Jena Gauss. M.D.  ASSISTANT: Baldwin [place], PA-C (present and scrubbed throughout the case, critical for assistance with exposure, retraction, instrumentation, and closure)  ANESTHESIA: spinal  ESTIMATED BLOOD LOSS: 50 mL  FLUIDS REPLACED: 1100 mL of crystalloid  TOURNIQUET TIME: 82 minutes  DRAINS: 2 medium Hemovac  SOFT TISSUE RELEASES: Anterior cruciate ligament, posterior cruciate ligament, deep medial collateral ligament, patellofemoral ligament  IMPLANTS UTILIZED: DePuy Attune size 6 posterior stabilized femoral component (cemented), size 6 rotating platform tibial component (cemented), 38 mm medialized dome patella (cemented), and a 10 mm stabilized rotating platform polyethylene insert.  Discharge Diagnoses: Patient Active Problem List   Diagnosis Date Noted  . Total knee replacement status 12/28/2019  . Anxiety 09/06/2018  . GERD without esophagitis 09/06/2018  . Pure hypercholesterolemia 09/06/2018  . Status post total right knee replacement 09/06/2018  . Borderline diabetes mellitus 02/06/2017  . Sepsis (H[facility]) 11/04/2014  . Cholelithiasis with choledocholithiasis 11/04/2014  . Elevated transaminase level 11/04/2014  . Hypokalemia 11/04/2014  . Liver lesion 11/04/2014  . Coronary atherosclerosis 11/04/2014  . SIRS (systemic inflammatory response syndrome) (H[facility]) 11/04/2014  . Acute encephalopathy 11/04/2014  . Lactic acidosis 11/04/2014  . Hypertension   . Hyperlipemia     Past Medical History:  Diagnosis Date  . Anxiety   . Arthritis   . Atherosclerosis   . Cholelithiasis with choledocholithiasis   . Diverticulosis   . Elevated  lipids   . GERD (gastroesophageal reflux disease)   . Hyperlipemia   . Hypertension   . Liver lesion   . Sepsis [facility])      Transfusion:    Consultants (if any):   Discharged Condition: Improved  Hospital Course: Jesus White is an 74 y.o. male who was admitted 12/28/2019 with a diagnosis of left knee osteoarthritis and went to the operating room on 12/28/2019 and underwent left total knee arthroplasty. The patient received perioperative antibiotics for prophylaxis (see below). The patient tolerated the procedure well and was transported to PACU in stable condition. After meeting PACU criteria, the patient was subsequently transferred to the Orthopaedics/Rehabilitation unit.   The patient received DVT prophylaxis in the form of early mobilization, Lovenox, Foot Pumps and TED hose. A sacral pad had been placed and heels were elevated off of the bed with rolled towels in order to protect skin integrity. Foley catheter was discontinued on postoperative day #0. Wound drains were discontinued on postoperative day #1. The surgical incision was healing well without signs of infection.  Physical therapy was initiated postoperatively for transfers, gait training, and strengthening. Occupational therapy was initiated for activities of daily living and evaluation for assisted devices. Rehabilitation goals were reviewed in detail with the patient. The patient made steady progress with physical therapy and physical therapy recommended discharge to Home.   The patient achieved the preliminary goals of this hospitalization and was felt to be medically and orthopaedically appropriate for discharge.  He was given perioperative antibiotics:  Anti-infectives (From admission, onward)   Start     Dose/Rate Route Frequency Ordered Stop   12/28/19 1148  ceFAZolin (ANCEF) IVPB 2g/100 mL premix        2 g 200 mL/hr over 30 Minutes Intravenous Every 6 hours 12/28/19 1148 12/28/19 2323   12/28/19  6160  ceFAZolin  (ANCEF) 2-4 GM/100ML-% IVPB       Note to Pharmacy: Elsie Ra   : cabinet override      12/28/19 0620 12/28/19 0741   12/28/19 0615  ceFAZolin (ANCEF) IVPB 2g/100 mL premix        2 g 200 mL/hr over 30 Minutes Intravenous On call to O.R. 12/28/19 7371 12/28/19 0626    .  Recent vital signs:  Vitals:   12/29/19 1226 12/29/19 1517  BP: 123/63 125/71  Pulse: 69 72  Resp: 16 17  Temp: 98 F (36.7 C) (!) 97.5 F (36.4 C)  SpO2: 95% 95%    Recent laboratory studies:  No results for input(s): WBC, HGB, HCT, PLT, K, CL, CO2, BUN, CREATININE, GLUCOSE, CALCIUM, LABPT, INR in the last 72 hours.  Diagnostic Studies: DG Knee Left Port  Result Date: 12/28/2019 CLINICAL DATA:  Status post total knee replacement. EXAM: PORTABLE LEFT KNEE - 1-2 VIEW COMPARISON:  None. FINDINGS: Sequelae of total knee arthroplasty are identified. The prosthetic components appear normally positioned. A drain and skin staples are in place, and there is gas within the knee joint and surrounding soft tissues. No acute fracture is identified. Atherosclerotic vascular calcifications are noted. IMPRESSION: Status post left total knee arthroplasty without evidence of acute complication. Electronically Signed   By: Sebastian Ache M.D.   On: 12/28/2019 11:07    Discharge Medications:   Allergies as of 12/29/2019      Reactions   Lisinopril Swelling   Lips and face       Medication List    TAKE these medications   acetaminophen 500 MG tablet Commonly known as: TYLENOL Take 1,000 mg by mouth in the morning, at noon, and at bedtime.   amLODipine 10 MG tablet Commonly known as: NORVASC Take 10 mg by mouth daily.   APPLE CIDER VINEGAR PO Take 20 mLs by mouth daily.   celecoxib 200 MG capsule Commonly known as: CELEBREX Take 1 capsule (200 mg total) by mouth 2 (two) times daily.   Cranberry 500 MG Tabs Take 500 mg by mouth daily.   enoxaparin 40 MG/0.4ML injection Commonly known as: LOVENOX Inject 0.4  mLs (40 mg total) into the skin daily for 14 days.   Fish Oil 1000 MG Caps Take 1,000 mg by mouth daily.   hydrochlorothiazide 25 MG tablet Commonly known as: HYDRODIURIL Take 25 mg by mouth daily.   Lidocaine 4 % Ptch Place 1 patch onto the skin daily.   losartan 50 MG tablet Commonly known as: COZAAR Take 75 mg by mouth daily.   lovastatin 40 MG tablet Commonly known as: MEVACOR Take 2 tablets (80 mg total) by mouth at bedtime. HOLD until LFT's are normalized What changed: additional instructions   milk thistle 175 MG tablet Take 175 mg by mouth daily.   multivitamin with minerals Tabs tablet Take 1 tablet by mouth daily. Centrum Silver   omeprazole 20 MG capsule Commonly known as: PRILOSEC Take 20 mg by mouth daily.   oxyCODONE 5 MG immediate release tablet Commonly known as: Oxy IR/ROXICODONE Take 1 tablet (5 mg total) by mouth every 4 (four) hours as needed for moderate pain (pain score 4-6).   PARoxetine 20 MG tablet Commonly known as: PAXIL Take 20 mg by mouth daily.   tetrahydrozoline 0.05 % ophthalmic solution Place 1 drop into both eyes daily as needed (eye irritation.).   traMADol 50 MG tablet Commonly known as: ULTRAM Take 1 tablet (50 mg  total) by mouth every 4 (four) hours as needed for moderate pain.   vitamin B-12 1000 MCG tablet Commonly known as: CYANOCOBALAMIN Take 1,000 mcg by mouth daily.   Zinc 50 MG Tabs Take 1 tablet by mouth daily.            Durable Medical Equipment  (From admission, onward)         Start     Ordered   12/28/19 1348  DME Walker rolling  Once       Question:  Patient needs a walker to treat with the following condition  Answer:  Total knee replacement status   12/28/19 1347   12/28/19 1348  DME Bedside commode  Once       Question:  Patient needs a bedside commode to treat with the following condition  Answer:  Total knee replacement status   12/28/19 1347          Disposition: home with home health  PT      Follow-up Information    [facility] On 12/13/2019.   Specialty: Orthopedic Surgery Why: at 11:15am Contact information: [address] [facility] [place] [address] [phone]        Donato Heinz, MD On 02/09/2020.   Specialty: Orthopedic Surgery Why: at 2:00pm Contact information: [address] [facility] [address] [facility] [place] [address] [phone]                Lasandra Beech, PA-C 12/29/2019, 5:36 PM

## 2019-12-29 NOTE — Evaluation (Signed)
Occupational Therapy Evaluation Patient Details Name: Jesus White MRN: [id number] DOB: April 18, 1945 Today's Date: 12/29/2019    History of Present Illness Pt is admitted for L TKR. History or R TKR 1 year ago, anxiety, GERD, and HTN.   Clinical Impression   Pt seen for OT evaluation this date, POD#1 from above surgery. Pt was independent in all ADL prior to surgery and is eager to return to PLOF with less pain and improved safety and independence. Pt currently requires PRN minimal assist for LB dressing while in seated position due to pain and limited AROM of L knee. Pt instructed in polar care mgt, falls prevention strategies, home/routines modifications, DME/AE for LB bathing and dressing tasks, and compression stocking mgt. Handout provided to support recall and carryover. Pt verbalized understanding, denies additional concerns or needs. Do not anticipate any additional OT needs at this time. Will sign off.      Follow Up Recommendations  No OT follow up    Equipment Recommendations  None recommended by OT    Recommendations for Other Services       Precautions / Restrictions Precautions Precautions: Fall;Knee Precaution Booklet Issued: Yes (comment) Restrictions Weight Bearing Restrictions: Yes LLE Weight Bearing: Weight bearing as tolerated      Mobility Bed Mobility Overal bed mobility: Needs Assistance Bed Mobility: Supine to Sit     Supine to sit: Supervision     General bed mobility comments: deferred, up in recliner    Transfers Overall transfer level: Modified independent Equipment used: Rolling walker (2 wheeled) Transfers: Sit to/from Stand Sit to Stand: Modified independent (Device/Increase time)         General transfer comment: safe technique. Upright posture noted    Balance Overall balance assessment: Needs assistance Sitting-balance support: Feet supported Sitting balance-Leahy Scale: Normal     Standing balance support: Bilateral upper  extremity supported Standing balance-Leahy Scale: Good                             ADL either performed or assessed with clinical judgement   ADL Overall ADL's : Needs assistance/impaired                                       General ADL Comments: PRN Min A for LB ADL, mod indep with ADL transfers with RW     Vision Patient Visual Report: No change from baseline       Perception     Praxis      Pertinent Vitals/Pain Pain Assessment: No/denies pain     Hand Dominance Right   Extremity/Trunk Assessment Upper Extremity Assessment Upper Extremity Assessment: Overall WFL for tasks assessed   Lower Extremity Assessment Lower Extremity Assessment: LLE deficits/detail LLE Deficits / Details: s/p L TKA       Communication Communication Communication: No difficulties   Cognition Arousal/Alertness: Awake/alert Behavior During Therapy: WFL for tasks assessed/performed Overall Cognitive Status: Within Functional Limits for tasks assessed                                     General Comments       Exercises Total Joint Exercises Goniometric ROM: L knee AAROM: 0-92 Other Exercises Other Exercises: supine ther-ex performed on L LE including AP, quad sets, SLRs, SAQ, heel  slides, and hip abd/add. All ther0ex performed x 12 reps with safe technique. Written HEP given and reviewed Other Exercises: Pt educated in falls prevention, home/routines modifications, compression stocking mgt, polar care mgt, AE/DME for ADL tasks; handout provided   Shoulder Instructions      Home Living Family/patient expects to be discharged to:: Private residence Living Arrangements: Spouse/significant other Available Help at Discharge: Family;Available 24 hours/day Type of Home: House Home Access: Stairs to enter [employer] of Steps: 3 Entrance Stairs-Rails: None Home Layout: One level               Home Equipment: Walker - 2  wheels;Cane - single point;Bedside commode          Prior Functioning/Environment Level of Independence: Independent                 OT Problem List: Decreased strength;Decreased range of motion      OT Treatment/Interventions:      OT Goals(Current goals can be found in the care plan section) Acute Rehab OT Goals Patient Stated Goal: to go home OT Goal Formulation: All assessment and education complete, DC therapy  OT Frequency:     Barriers to D/C:            Co-evaluation              AM-PAC OT "6 Clicks" Daily Activity     Outcome Measure Help from another person eating meals?: None Help from another person taking care of personal grooming?: None Help from another person toileting, which includes using toliet, bedpan, or urinal?: None Help from another person bathing (including washing, rinsing, drying)?: A Little Help from another person to put on and taking off regular upper body clothing?: None Help from another person to put on and taking off regular lower body clothing?: A Little 6 Click Score: 22   End of Session    Activity Tolerance: Patient tolerated treatment well Patient left: in chair;with call bell/phone within reach;with chair alarm set;Other (comment) (polar care in place)  OT Visit Diagnosis: Other abnormalities of gait and mobility (R26.89)                Time: 6269-4854 OT Time Calculation (min): 12 min Charges:  OT General Charges $OT Visit: 1 Visit OT Evaluation $OT Eval Low Complexity: 1 Low OT Treatments $Self Care/Home Management : 8-22 mins  Richrd Prime, MPH, MS, OTR/L ascom [phone] 12/29/19, 10:26 AM

## 2022-06-28 IMAGING — DX DG KNEE 1-2V PORT*L*
2 series · 2 of 2 positions shown · non-contrast
Comparison: None.

CLINICAL DATA: Status post total knee replacement.

EXAM:
PORTABLE LEFT KNEE - 1-2 VIEW

[knee lat]
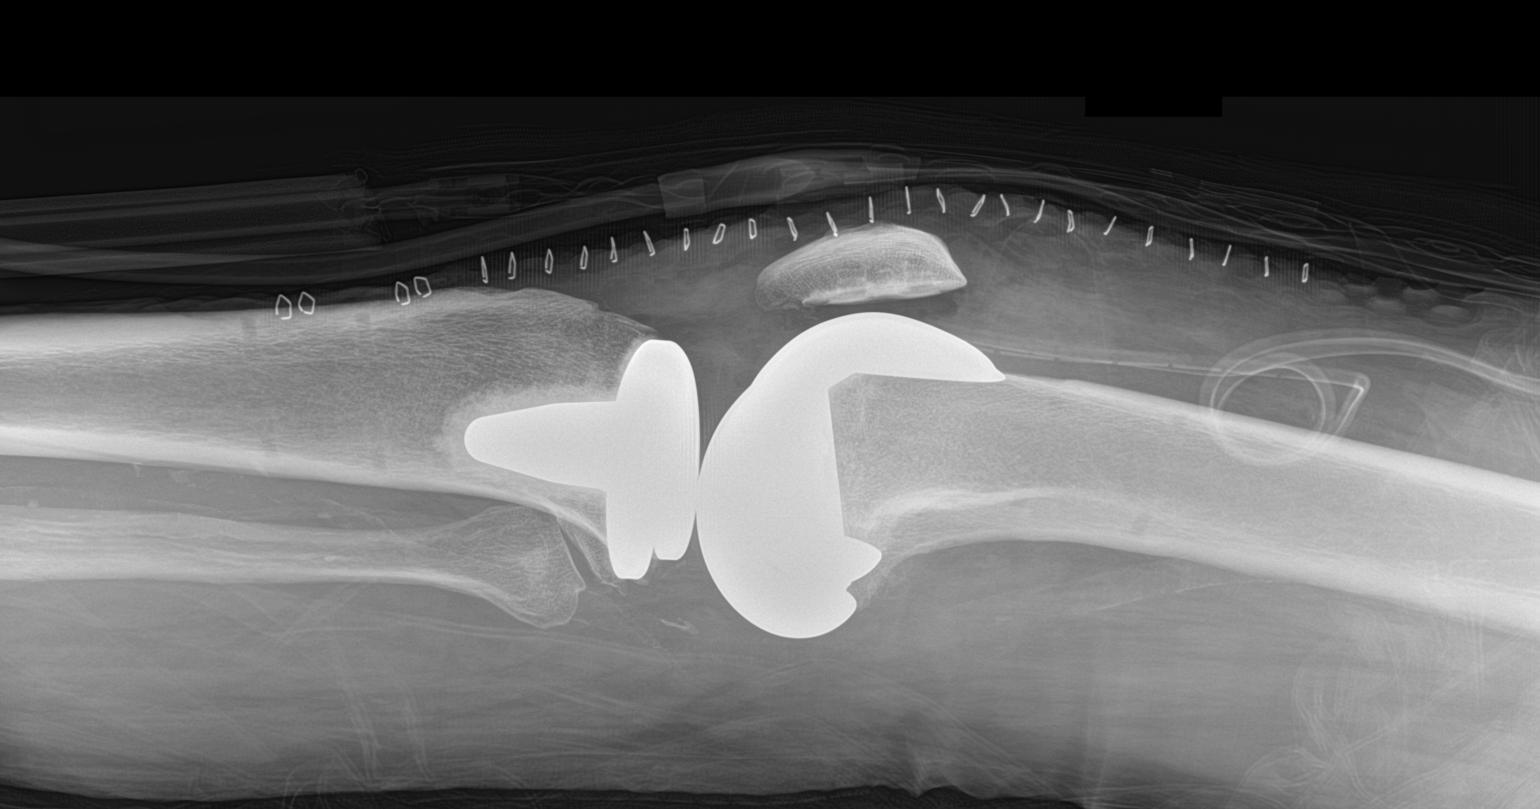

[knee ap]
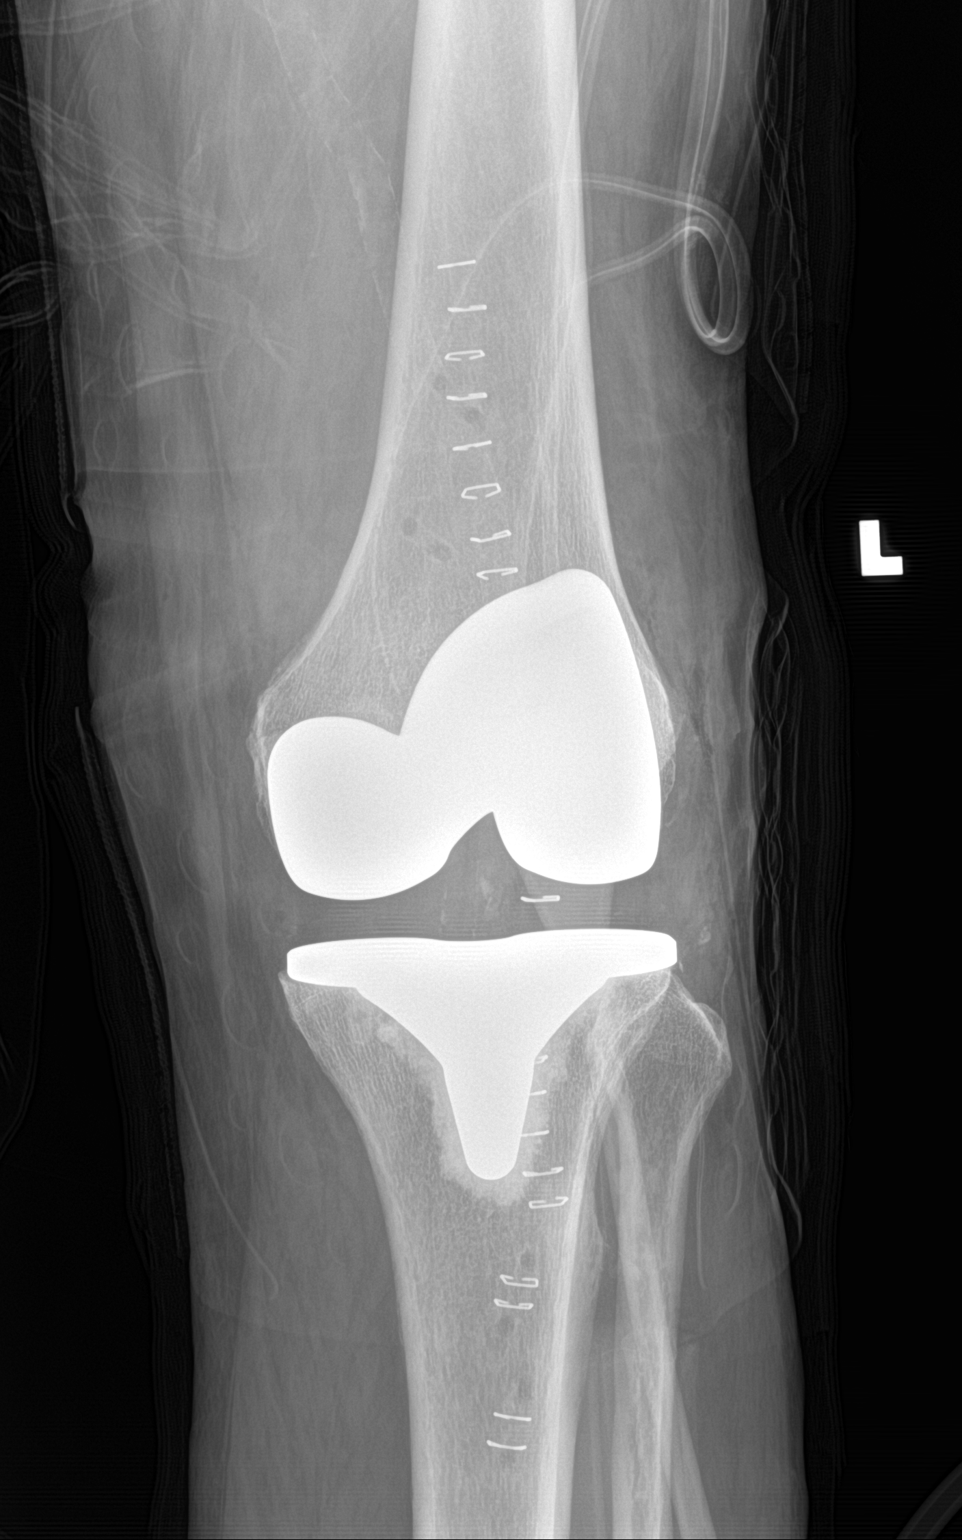

[2 of 2 positions shown; findings below may reference images not displayed]

FINDINGS: Sequelae of total knee arthroplasty are identified. The prosthetic
components appear normally positioned. A drain and skin staples are
in place, and there is gas within the knee joint and surrounding
soft tissues. No acute fracture is identified. Atherosclerotic
vascular calcifications are noted.
IMPRESSION: Status post left total knee arthroplasty without evidence of acute
complication.

## 2022-10-28 ENCOUNTER — Encounter: Payer: Self-pay | Admitting: Dermatology

## 2022-10-28 ENCOUNTER — Ambulatory Visit: Payer: Medicare Other | Admitting: Dermatology

## 2022-10-28 DIAGNOSIS — C4492 Squamous cell carcinoma of skin, unspecified: Secondary | ICD-10-CM

## 2022-10-28 DIAGNOSIS — D044 Carcinoma in situ of skin of scalp and neck: Secondary | ICD-10-CM

## 2022-10-28 DIAGNOSIS — Z5111 Encounter for antineoplastic chemotherapy: Secondary | ICD-10-CM

## 2022-10-28 DIAGNOSIS — L578 Other skin changes due to chronic exposure to nonionizing radiation: Secondary | ICD-10-CM | POA: Diagnosis not present

## 2022-10-28 DIAGNOSIS — Z85828 Personal history of other malignant neoplasm of skin: Secondary | ICD-10-CM

## 2022-10-28 DIAGNOSIS — W908XXA Exposure to other nonionizing radiation, initial encounter: Secondary | ICD-10-CM | POA: Diagnosis not present

## 2022-10-28 DIAGNOSIS — D492 Neoplasm of unspecified behavior of bone, soft tissue, and skin: Secondary | ICD-10-CM | POA: Diagnosis not present

## 2022-10-28 DIAGNOSIS — L570 Actinic keratosis: Secondary | ICD-10-CM | POA: Diagnosis not present

## 2022-10-28 DIAGNOSIS — L905 Scar conditions and fibrosis of skin: Secondary | ICD-10-CM

## 2022-10-28 HISTORY — DX: Squamous cell carcinoma of skin, unspecified: C44.92

## 2022-10-28 MED ORDER — FLUOROURACIL 5 % EX CREA: TOPICAL_CREAM | Freq: Two times a day (BID) | CUTANEOUS | 2 refills | Status: DC

## 2022-10-28 NOTE — Progress Notes (Signed)
New Patient Visit   Subjective  Jesus White is a 77 y.o. male who presents for the following: Patient c/o itchy spots on his scalp that will not go away.   The patient has spots, moles and lesions to be evaluated, some may be new or changing and the patient may have concern these could be cancer.  Wife is with patient and contributes to history.   The following portions of the chart were reviewed this encounter and updated as appropriate: medications, allergies, medical history  Review of Systems:  No other skin or systemic complaints except as noted in HPI or Assessment and Plan.  Objective  Well appearing patient in no apparent distress; mood and affect are within normal limits.  A focused examination was performed of the following areas:face,scalp   Relevant physical exam findings are noted in the Assessment and Plan.  scalp x 9, central forehead x 1, left superior forehead x 1 (11) Erythematous thin papules/macules with gritty scale.   central frontal scalp 1 cm keratotic papule        Assessment & Plan   ACTINIC DAMAGE WITH PRECANCEROUS ACTINIC KERATOSES Counseling for Topical Chemotherapy Management: Patient exhibits: - Severe, confluent actinic changes with pre-cancerous actinic keratoses that is secondary to cumulative UV radiation exposure over time - Condition that is severe; chronic, not at goal. - diffuse scaly erythematous macules and papules with underlying dyspigmentation - Discussed Prescription "Field Treatment" topical Chemotherapy for Severe, Chronic Confluent Actinic Changes with Pre-Cancerous Actinic Keratoses Field treatment involves treatment of an entire area of skin that has confluent Actinic Changes (Sun/ Ultraviolet light damage) and PreCancerous Actinic Keratoses by method of PhotoDynamic Therapy (PDT) and/or prescription Topical Chemotherapy agents such as 5-fluorouracil, 5-fluorouracil/calcipotriene, and/or imiquimod.  The purpose is to  decrease the number of clinically evident and subclinical PreCancerous lesions to prevent progression to development of skin cancer by chemically destroying early precancer changes that may or may not be visible.  It has been shown to reduce the risk of developing skin cancer in the treated area. As a result of treatment, redness, scaling, crusting, and open sores may occur during treatment course. One or more than one of these methods may be used and may have to be used several times to control, suppress and eliminate the PreCancerous changes. Discussed treatment course, expected reaction, and possible side effects. - Recommend daily broad spectrum sunscreen SPF 30+ to sun-exposed areas, reapply every 2 hours as needed.  - Staying in the shade or wearing long sleeves, sun glasses (UVA+UVB protection) and wide brim hats (4-inch brim around the entire circumference of the hat) are also recommended. - Call for new or changing lesions.  Start 5FU/Calcipotriene cream apply to scalp twice a day for up to 7 days     AK (actinic keratosis) (11) scalp x 9, central forehead x 1, left superior forehead x 1  Actinic keratoses are precancerous spots that appear secondary to cumulative UV radiation exposure/sun exposure over time. They are chronic with expected duration over 1 year. A portion of actinic keratoses will progress to squamous cell carcinoma of the skin. It is not possible to reliably predict which spots will progress to skin cancer and so treatment is recommended to prevent development of skin cancer.  Recommend daily broad spectrum sunscreen SPF 30+ to sun-exposed areas, reapply every 2 hours as needed.  Recommend staying in the shade or wearing long sleeves, sun glasses (UVA+UVB protection) and wide brim hats (4-inch brim around the entire circumference  of the hat). Call for new or changing lesions.   Destruction of lesion - scalp x 9, central forehead x 1, left superior forehead x 1  (11) Complexity: simple   Destruction method: cryotherapy   Informed consent: discussed and consent obtained   Timeout:  patient name, date of birth, surgical site, and procedure verified Lesion destroyed using liquid nitrogen: Yes   Region frozen until ice ball extended beyond lesion: Yes   Outcome: patient tolerated procedure well with no complications   Post-procedure details: wound care instructions given   Additional details:  Prior to procedure, discussed risks of blister formation, small wound, skin dyspigmentation, or rare scar following cryotherapy. Recommend Vaseline ointment to treated areas while healing.   Neoplasm of skin central frontal scalp  Skin / nail biopsy Type of biopsy: tangential   Informed consent: discussed and consent obtained   Timeout: patient name, date of birth, surgical site, and procedure verified   Procedure prep:  Patient was prepped and draped in usual sterile fashion Prep type:  Isopropyl alcohol Anesthesia: the lesion was anesthetized in a standard fashion   Anesthetic:  1% lidocaine w/ epinephrine 1-100,000 buffered w/ 8.4% NaHCO3 Instrument used: DermaBlade   Hemostasis achieved with: pressure and aluminum chloride   Outcome: patient tolerated procedure well   Post-procedure details: sterile dressing applied and wound care instructions given   Dressing type: bandage and petrolatum    Specimen 1 - Surgical pathology Differential Diagnosis: R/O SCC   Check Margins: No    SCAR/HISTORY OF SKIN CANCER   Right lateral cheek  Linear scar  Observe    Return in about 3 months (around 01/28/2023) for Aks .  IAngelique Holm, CMA, am acting as scribe for Elie Goody, MD .   Documentation: I have reviewed the above documentation for accuracy and completeness, and I agree with the above.  Elie Goody, MD

## 2022-10-28 NOTE — Patient Instructions (Addendum)
[facility]  [address] [place], [place] [address]  Phone: [phone]     5-Fluorouracil/Calcipotriene Patient Education  Apply to face and scalp twice a day for up to 7 days then stop   Actinic keratoses are the dry, red scaly spots on the skin caused by sun damage. A portion of these spots can turn into skin cancer with time, and treating them can help prevent development of skin cancer.   Treatment of these spots requires removal of the defective skin cells. There are various ways to remove actinic keratoses, including freezing with liquid nitrogen, treatment with creams, or treatment with a blue light procedure in the office.   5-fluorouracil cream is a topical cream used to treat actinic keratoses. It works by interfering with the growth of abnormal fast-growing skin cells, such as actinic keratoses. These cells peel off and are replaced by healthy ones.   5-fluorouracil/calcipotriene is a combination of the 5-fluorouracil cream with a vitamin D analog cream called calcipotriene. The calcipotriene alone does not treat actinic keratoses. However, when it is combined with 5-fluorouracil, it helps the 5-fluorouracil treat the actinic keratoses much faster so that the same results can be achieved with a much shorter treatment time.  INSTRUCTIONS FOR 5-FLUOROURACIL/CALCIPOTRIENE CREAM:   5-fluorouracil/calcipotriene cream typically only needs to be used for 4-7 days. A thin layer should be applied twice a day to the treatment areas recommended by your physician.   If your physician prescribed you separate tubes of 5-fluourouracil and calcipotriene, apply a thin layer of 5-fluorouracil followed by a thin layer of calcipotriene.   Avoid contact with your eyes, nostrils, and mouth. Do not use 5-fluorouracil/calcipotriene cream on infected or open wounds.   You will develop redness, irritation and some crusting at areas where you have pre-cancer damage/actinic keratoses. IF YOU  DEVELOP PAIN, BLEEDING, OR SIGNIFICANT CRUSTING, STOP THE TREATMENT EARLY - you have already gotten a good response and the actinic keratoses should clear up well.  Wash your hands after applying 5-fluorouracil 5% cream on your skin.   A moisturizer or sunscreen with a minimum SPF 30 should be applied each morning.   Once you have finished the treatment, you can apply a thin layer of Vaseline twice a day to irritated areas to soothe and calm the areas more quickly. If you experience significant discomfort, contact your physician.  For some patients it is necessary to repeat the treatment for best results.  SIDE EFFECTS: When using 5-fluorouracil/calcipotriene cream, you may have mild irritation, such as redness, dryness, swelling, or a mild burning sensation. This usually resolves within 2 weeks. The more actinic keratoses you have, the more redness and inflammation you can expect during treatment. Eye irritation has been reported rarely. If this occurs, please let us know.  If you have any trouble using this cream, please call the office. If you have any other questions about this information, please do not hesitate to ask me before you leave the office.         Cryotherapy Aftercare  Wash gently with soap and water everyday.   Apply Vaseline and Band-Aid daily until healed.    Wound Care Instructions  Cleanse wound gently with soap and water once a day then pat dry with clean gauze. Apply a thin coat of Petrolatum (petroleum jelly, "Vaseline") over the wound (unless you have an allergy to this). We recommend that you use a new, sterile tube of Vaseline. Do not pick or remove scabs. Do not remove the yellow or  white "healing tissue" from the base of the wound.  Cover the wound with fresh, clean, nonstick gauze and secure with paper tape. You may use Band-Aids in place of gauze and tape if the wound is small enough, but would recommend trimming much of the tape off as there is often too  much. Sometimes Band-Aids can irritate the skin.  You should call the office for your biopsy report after 1 week if you have not already been contacted.  If you experience any problems, such as abnormal amounts of bleeding, swelling, significant bruising, significant pain, or evidence of infection, please call the office immediately.  FOR ADULT SURGERY PATIENTS: If you need something for pain relief you may take 1 extra strength Tylenol (acetaminophen) AND 2 Ibuprofen (200mg  each) together every 4 hours as needed for pain. (do not take these if you are allergic to them or if you have a reason you should not take them.) Typically, you may only need pain medication for 1 to 3 days.         Due to recent changes in healthcare laws, you may see results of your pathology and/or laboratory studies on MyChart before the doctors have had a chance to review them. We understand that in some cases there may be results that are confusing or concerning to you. Please understand that not all results are received at the same time and often the doctors may need to interpret multiple results in order to provide you with the best plan of care or course of treatment. Therefore, we ask that you please give [place] 2 business days to thoroughly review all your results before contacting the office for clarification. Should we see a critical lab result, you will be contacted sooner.   If You Need Anything After Your Visit  If you have any questions or concerns for your doctor, please call our main line at [phone] and press option 4 to reach your doctor's medical assistant. If no one answers, please leave a voicemail as directed and we will return your call as soon as possible. Messages left after 4 pm will be answered the following business day.   You may also send [place] a message via MyChart. We typically respond to MyChart messages within 1-2 business days.  For prescription refills, please ask your pharmacy to  contact our office. Our fax number is [phone].  If you have an urgent issue when the clinic is closed that cannot wait until the next business day, you can page your doctor at the number below.    Please note that while we do our best to be available for urgent issues outside of office hours, we are not available 24/7.   If you have an urgent issue and are unable to reach [place], you may choose to seek medical care at your doctor's office, retail clinic, urgent care center, or emergency room.  If you have a medical emergency, please immediately call 911 or go to the emergency department.  Pager Numbers  - Dr. Gwen Pounds: [phone]  - Dr. Roseanne Reno: [phone]  - Dr. Katrinka Blazing: [phone]   In the event of inclement weather, please call our main line at [phone] for an update on the status of any delays or closures.  Dermatology Medication Tips: Please keep the boxes that topical medications come in in order to help keep track of the instructions about where and how to use these. Pharmacies typically print the medication instructions only on the boxes and not directly on the  medication tubes.   If your medication is too expensive, please contact our office at [phone] option 4 or send [place] a message through MyChart.   We are unable to tell what your co-pay for medications will be in advance as this is different depending on your insurance coverage. However, we may be able to find a substitute medication at lower cost or fill out paperwork to get insurance to cover a needed medication.   If a prior authorization is required to get your medication covered by your insurance company, please allow [place] 1-2 business days to complete this process.  Drug prices often vary depending on where the prescription is filled and some pharmacies may offer cheaper prices.  The website www.goodrx.com contains coupons for medications through different pharmacies. The prices here do not account for what  the cost may be with help from insurance (it may be cheaper with your insurance), but the website can give you the price if you did not use any insurance.  - You can print the associated coupon and take it with your prescription to the pharmacy.  - You may also stop by our office during regular business hours and pick up a GoodRx coupon card.  - If you need your prescription sent electronically to a different pharmacy, notify our office through [facility] or by phone at [phone] option 4.     Si Usted Necesita Algo Despus de Su Visita  Tambin puede enviarnos un mensaje a travs de Clinical cytogeneticist. Por lo general respondemos a los mensajes de MyChart en el transcurso de 1 a 2 das hbiles.  Para renovar recetas, por favor pida a su farmacia que se ponga en contacto con nuestra oficina. Annie Sable de fax es [place] [phone].  Si tiene un asunto urgente cuando la clnica est cerrada y que no puede esperar hasta el siguiente da hbil, puede llamar/localizar a su doctor(a) al nmero que aparece a continuacin.   Por favor, tenga en cuenta que aunque hacemos todo lo posible para estar disponibles para asuntos urgentes fuera del horario de [place], no estamos disponibles las 24 horas del da, los 7 [address]  [address] de la [place].   Si tiene un problema urgente y no puede comunicarse con nosotros, puede optar por buscar atencin mdica  en el consultorio de su doctor(a), en una clnica privada, en un centro de atencin urgente o en una sala de emergencias.  Si tiene Engineer, drilling, por favor llame inmediatamente al 911 o vaya a la sala de emergencias.  Nmeros de bper  - Dr. Gwen Pounds: [phone]  - Dra. Roseanne Reno: [phone]  - Dr. Katrinka Blazing: [phone]   En caso de inclemencias del tiempo, por favor llame a Lacy Duverney principal al [phone] para una actualizacin sobre el [place] de cualquier retraso o cierre.  Consejos para la medicacin en dermatologa: Por favor, guarde las  cajas en las que vienen los medicamentos de uso tpico para ayudarle a seguir las instrucciones sobre dnde y cmo usarlos. Las farmacias generalmente imprimen las instrucciones del medicamento slo en las cajas y no directamente en los tubos del [place].   Si su medicamento es muy caro, por favor, pngase en contacto con Rolm Gala llamando al [phone] y presione la opcin 4 o envenos un mensaje a travs de Clinical cytogeneticist.   No podemos decirle cul ser su copago por los medicamentos por adelantado ya que esto es diferente dependiendo de la cobertura de su seguro. Sin embargo, es posible que podamos encontrar un medicamento sustituto a  menor costo o llenar un formulario para que el seguro cubra el medicamento que se considera necesario.   Si se requiere una autorizacin previa para que su compaa de seguros [place] su medicamento, por favor permtanos de 1 a 2 das hbiles para completar [address]  Los precios de los medicamentos varan con frecuencia dependiendo del Environmental consultant de dnde se surte la receta y alguna farmacias pueden ofrecer precios ms baratos.  El sitio web www.goodrx.com tiene cupones para medicamentos de Health and safety inspector. Los precios aqu no tienen en cuenta lo que podra costar con la ayuda del seguro (puede ser ms barato con su seguro), pero el sitio web puede darle el precio si no utiliz Tourist information centre manager.  - Puede imprimir el cupn correspondiente y llevarlo con su receta a la farmacia.  - Tambin puede pasar por nuestra oficina durante el horario de atencin regular y Education officer, museum una tarjeta de cupones de GoodRx.  - Si necesita que su receta se enve electrnicamente a una farmacia diferente, informe a nuestra oficina a travs de MyChart de [place] o por telfono llamando al [phone] y presione la opcin 4.

## 2022-10-31 LAB — SURGICAL PATHOLOGY

## 2022-11-04 ENCOUNTER — Telehealth: Payer: Self-pay

## 2022-11-04 NOTE — Telephone Encounter (Signed)
Left message on voicemail to return my call.  

## 2022-11-04 NOTE — Telephone Encounter (Signed)
-----   Message from [place] sent at 10/31/2022  6:52 PM EDT ----- Diagnosis SQUAMOUS CELL CARCINOMA IN SITU   Please call with diagnosis and message me with patient's decision on treatment.   Explanation: This is a squamous cell skin cancer limited to the top layer of skin. This means it is an early cancer and has not spread. However, it has the potential to spread beyond the skin and threaten your health, so we recommend treating it.   Treatment option 1: a cream (fluorouracil and calcipotriene) that helps your immune system clear the skin cancer. It will cause redness and irritation. Wait two weeks after the biopsy to start applying the cream. Apply the cream twice per day until the redness and irritation develop (usually occurs by day 7), then stop and allow it to heal. We will recheck the area in 2 months to ensure the cancer is gone. The cream is $35 plus shipping and will be mailed to you from a low cost compounding pharmacy.   Treatment option 2: Mohs surgery, which involves cutting out right around the skin cancer and then checking under the microscope on the same day to ensure the whole skin cancer is out. If there is more cancer remaining, the surgeon will repeat the process until it is fully cleared. The cure rate is about 98-99%. It is done at another office outside of [place] ([place], [place], or [place]). Once the Mohs surgeon confirms the skin cancer is out, they will discuss the options to repair or heal the area. You must take it easy for about two weeks after surgery (no lifting over 10-15 lbs, avoid activity to get your heart rate and blood pressure up).

## 2022-11-04 NOTE — Telephone Encounter (Signed)
Called pt discussed biopsy results show SCCis, patient report he has ordered 5FU/Calcipotriene cream he will start using it on the biopsy site-central frontal scalp as soon as he received it in the mail, he will follow up here with Dr Katrinka Blazing in 2 months

## 2022-11-04 NOTE — Telephone Encounter (Signed)
Called pt discussed biopsy results show SCCis, discussed treatment options, patient report he has ordered 5FU/Calcipotriene cream he will start using it on the biopsy site-central frontal scalp as soon as he receive it in the mail, he will follow up here with Dr Katrinka Blazing in 2 months

## 2023-01-27 ENCOUNTER — Ambulatory Visit: Payer: Medicare Other | Admitting: Dermatology

## 2023-01-27 ENCOUNTER — Encounter: Payer: Self-pay | Admitting: Dermatology

## 2023-01-27 DIAGNOSIS — W908XXA Exposure to other nonionizing radiation, initial encounter: Secondary | ICD-10-CM

## 2023-01-27 DIAGNOSIS — Z86007 Personal history of in-situ neoplasm of skin: Secondary | ICD-10-CM | POA: Diagnosis not present

## 2023-01-27 DIAGNOSIS — L570 Actinic keratosis: Secondary | ICD-10-CM

## 2023-01-27 NOTE — Patient Instructions (Signed)

## 2023-01-27 NOTE — Progress Notes (Signed)
   Follow-Up Visit   Subjective  Jesus White is a 78 y.o. male who presents for the following: Ak follow up S/P LN2, and SCCIS recheck of the central frontal scalp, pt did not use 5FU/Calcipotriene cream mix yet, he has used it in the past and had a significant reaction so he is hesitant to try it again, but states that he will if he needs to.   The patient has spots, moles and lesions to be evaluated, some may be new or changing.  The following portions of the chart were reviewed this encounter and updated as appropriate: medications, allergies, medical history  Review of Systems:  No other skin or systemic complaints except as noted in HPI or Assessment and Plan.  Objective  Well appearing patient in no apparent distress; mood and affect are within normal limits.   A focused examination was performed of the following areas: the face and scalp   Relevant exam findings are noted in the Assessment and Plan.  L periorbital x 1, R periorbital x 1, R temple x 1, forehead x 5, frontal scalp x 14 (22) Erythematous thin papules/macules with gritty scale.   Assessment & Plan   HISTORY OF SQUAMOUS CELL CARCINOMA IN SITU OF THE SKIN - central frontal scalp, patient did not use topical 5FU/Calcipotriene mix, will continue to observe. May treat in the future if necessary.  - No evidence of recurrence today - Recommend regular full body skin exams - Recommend daily broad spectrum sunscreen SPF 30+ to sun-exposed areas, reapply every 2 hours as needed.  - Call if any new or changing lesions are noted between office visits AK (ACTINIC KERATOSIS) (22) L periorbital x 1, R periorbital x 1, R temple x 1, forehead x 5, frontal scalp x 14 (22) Actinic keratoses are precancerous spots that appear secondary to cumulative UV radiation exposure/sun exposure over time. They are chronic with expected duration over 1 year. A portion of actinic keratoses will progress to squamous cell carcinoma of the skin.  It is not possible to reliably predict which spots will progress to skin cancer and so treatment is recommended to prevent development of skin cancer.  Recommend daily broad spectrum sunscreen SPF 30+ to sun-exposed areas, reapply every 2 hours as needed.  Recommend staying in the shade or wearing long sleeves, sun glasses (UVA+UVB protection) and wide brim hats (4-inch brim around the entire circumference of the hat). Call for new or changing lesions.  Destruction of lesion - L periorbital x 1, R periorbital x 1, R temple x 1, forehead x 5, frontal scalp x 14 (22) Complexity: simple   Destruction method: cryotherapy   Informed consent: discussed and consent obtained   Timeout:  patient name, date of birth, surgical site, and procedure verified Lesion destroyed using liquid nitrogen: Yes   Region frozen until ice ball extended beyond lesion: Yes   Outcome: patient tolerated procedure well with no complications   Post-procedure details: wound care instructions given    Return in about 3 months (around 04/27/2023) for TBSE.  Jesus White, CMA, am acting as scribe for Boneta Sharps, MD .   Documentation: I have reviewed the above documentation for accuracy and completeness, and I agree with the above.  Boneta Sharps, MD

## 2023-05-04 ENCOUNTER — Ambulatory Visit: Payer: Medicare Other | Admitting: Dermatology

## 2023-05-04 ENCOUNTER — Encounter: Payer: Self-pay | Admitting: Dermatology

## 2023-05-04 DIAGNOSIS — L821 Other seborrheic keratosis: Secondary | ICD-10-CM | POA: Diagnosis not present

## 2023-05-04 DIAGNOSIS — Z86007 Personal history of in-situ neoplasm of skin: Secondary | ICD-10-CM

## 2023-05-04 DIAGNOSIS — L570 Actinic keratosis: Secondary | ICD-10-CM | POA: Diagnosis not present

## 2023-05-04 DIAGNOSIS — Z1283 Encounter for screening for malignant neoplasm of skin: Secondary | ICD-10-CM | POA: Diagnosis not present

## 2023-05-04 DIAGNOSIS — L814 Other melanin hyperpigmentation: Secondary | ICD-10-CM

## 2023-05-04 DIAGNOSIS — W908XXA Exposure to other nonionizing radiation, initial encounter: Secondary | ICD-10-CM

## 2023-05-04 DIAGNOSIS — L578 Other skin changes due to chronic exposure to nonionizing radiation: Secondary | ICD-10-CM

## 2023-05-04 DIAGNOSIS — D229 Melanocytic nevi, unspecified: Secondary | ICD-10-CM

## 2023-05-04 DIAGNOSIS — D1801 Hemangioma of skin and subcutaneous tissue: Secondary | ICD-10-CM | POA: Diagnosis not present

## 2023-05-04 NOTE — Patient Instructions (Addendum)
 Recommend daily broad spectrum sunscreen SPF 30+ to sun-exposed areas, reapply every 2 hours as needed. Call for new or changing lesions.  Staying in the shade or wearing long sleeves, sun glasses (UVA+UVB protection) and wide brim hats (4-inch brim around the entire circumference of the hat) are also recommended for sun protection.    Cryotherapy Aftercare  Wash gently with soap and water everyday.   Apply Vaseline and Band-Aid daily until healed.    Melanoma ABCDEs  Melanoma is the most dangerous type of skin cancer, and is the leading cause of death from skin disease.  You are more likely to develop melanoma if you: Have light-colored skin, light-colored eyes, or red or blond hair Spend a lot of time in the sun Tan regularly, either outdoors or in a tanning bed Have had blistering sunburns, especially during childhood Have a close family member who has had a melanoma Have atypical moles or large birthmarks  Early detection of melanoma is key since treatment is typically straightforward and cure rates are extremely high if we catch it early.   The first sign of melanoma is often a change in a mole or a new dark spot.  The ABCDE system is a way of remembering the signs of melanoma.  A for asymmetry:  The two halves do not match. B for border:  The edges of the growth are irregular. C for color:  A mixture of colors are present instead of an even brown color. D for diameter:  Melanomas are usually (but not always) greater than 6mm - the size of a pencil eraser. E for evolution:  The spot keeps changing in size, shape, and color.  Please check your skin once per month between visits. You can use a small mirror in front and a large mirror behind you to keep an eye on the back side or your body.   If you see any new or changing lesions before your next follow-up, please call to schedule a visit.  Please continue daily skin protection including broad spectrum sunscreen SPF 30+ to  sun-exposed areas, reapplying every 2 hours as needed when you're outdoors.   Staying in the shade or wearing long sleeves, sun glasses (UVA+UVB protection) and wide brim hats (4-inch brim around the entire circumference of the hat) are also recommended for sun protection.      Due to recent changes in healthcare laws, you may see results of your pathology and/or laboratory studies on MyChart before the doctors have had a chance to review them. We understand that in some cases there may be results that are confusing or concerning to you. Please understand that not all results are received at the same time and often the doctors may need to interpret multiple results in order to provide you with the best plan of care or course of treatment. Therefore, we ask that you please give [place] 2 business days to thoroughly review all your results before contacting the office for clarification. Should we see a critical lab result, you will be contacted sooner.   If You Need Anything After Your Visit  If you have any questions or concerns for your doctor, please call our main line at [phone] and press option 4 to reach your doctor's medical assistant. If no one answers, please leave a voicemail as directed and we will return your call as soon as possible. Messages left after 4 pm will be answered the following business day.   You may also send [place] a  message via MyChart. We typically respond to MyChart messages within 1-2 business days.  For prescription refills, please ask your pharmacy to contact our office. Our fax number is [phone].  If you have an urgent issue when the clinic is closed that cannot wait until the next business day, you can page your doctor at the number below.    Please note that while we do our best to be available for urgent issues outside of office hours, we are not available 24/7.   If you have an urgent issue and are unable to reach [place], you may choose to seek medical care at  your doctor's office, retail clinic, urgent care center, or emergency room.  If you have a medical emergency, please immediately call 911 or go to the emergency department.  Pager Numbers  - Dr. Gwen Pounds: [phone]  - Dr. Roseanne Reno: [phone]  - Dr. Katrinka Blazing: [phone]   In the event of inclement weather, please call our main line at [phone] for an update on the status of any delays or closures.  Dermatology Medication Tips: Please keep the boxes that topical medications come in in order to help keep track of the instructions about where and how to use these. Pharmacies typically print the medication instructions only on the boxes and not directly on the medication tubes.   If your medication is too expensive, please contact our office at [phone] option 4 or send [place] a message through MyChart.   We are unable to tell what your co-pay for medications will be in advance as this is different depending on your insurance coverage. However, we may be able to find a substitute medication at lower cost or fill out paperwork to get insurance to cover a needed medication.   If a prior authorization is required to get your medication covered by your insurance company, please allow [place] 1-2 business days to complete this process.  Drug prices often vary depending on where the prescription is filled and some pharmacies may offer cheaper prices.  The website www.goodrx.com contains coupons for medications through different pharmacies. The prices here do not account for what the cost may be with help from insurance (it may be cheaper with your insurance), but the website can give you the price if you did not use any insurance.  - You can print the associated coupon and take it with your prescription to the pharmacy.  - You may also stop by our office during regular business hours and pick up a GoodRx coupon card.  - If you need your prescription sent electronically to a different pharmacy,  notify our office through [facility] or by phone at [phone] option 4.     Si Usted Necesita Algo Despus de Su Visita  Tambin puede enviarnos un mensaje a travs de Clinical cytogeneticist. Por lo general respondemos a los mensajes de MyChart en el transcurso de 1 a 2 das hbiles.  Para renovar recetas, por favor pida a su farmacia que se ponga en contacto con nuestra oficina. Annie Sable de fax es [place] [phone].  Si tiene un asunto urgente cuando la clnica est cerrada y que no puede esperar hasta el siguiente da hbil, puede llamar/localizar a su doctor(a) al nmero que aparece a continuacin.   Por favor, tenga en cuenta que aunque hacemos todo lo posible para estar disponibles para asuntos urgentes fuera del horario de [place], no estamos disponibles las 24 horas del da, los 7 [address]  [address] de la [place].   Si tiene un problema  urgente y no puede comunicarse con nosotros, puede optar por buscar atencin mdica  en el consultorio de su doctor(a), en una clnica privada, en un centro de atencin urgente o en una sala de emergencias.  Si tiene Engineer, drilling, por favor llame inmediatamente al 911 o vaya a la sala de emergencias.  Nmeros de bper  - Dr. Bary Likes: [phone]  - Dra. Annette Barters: [phone]  - Dr. Felipe Horton: [phone]   En caso de inclemencias del tiempo, por favor llame a Lajuan Pila principal al [phone] para una actualizacin sobre el [place] de cualquier retraso o cierre.  Consejos para la medicacin en dermatologa: Por favor, guarde las cajas en las que vienen los medicamentos de uso tpico para ayudarle a seguir las instrucciones sobre dnde y cmo usarlos. Las farmacias generalmente imprimen las instrucciones del medicamento slo en las cajas y no directamente en los tubos del [place].   Si su medicamento es muy caro, por favor, pngase en contacto con Bettyjane Brunet llamando al [phone] y presione la opcin 4 o envenos un mensaje a travs de  Clinical cytogeneticist.   No podemos decirle cul ser su copago por los medicamentos por adelantado ya que esto es diferente dependiendo de la cobertura de su seguro. Sin embargo, es posible que podamos encontrar un medicamento sustituto a Audiological scientist un formulario para que el seguro cubra el medicamento que se considera necesario.   Si se requiere una autorizacin previa para que su compaa de seguros [place] su medicamento, por favor permtanos de 1 a 2 das hbiles para completar este proceso.  Los precios de los medicamentos varan con frecuencia dependiendo del Environmental consultant de dnde se surte la receta y alguna farmacias pueden ofrecer precios ms baratos.  El sitio web www.goodrx.com tiene cupones para medicamentos de Health and safety inspector. Los precios aqu no tienen en cuenta lo que podra costar con la ayuda del seguro (puede ser ms barato con su seguro), pero el sitio web puede darle el precio si no utiliz Tourist information centre manager.  - Puede imprimir el cupn correspondiente y llevarlo con su receta a la farmacia.  - Tambin puede pasar por nuestra oficina durante el horario de atencin regular y Education officer, museum una tarjeta de cupones de GoodRx.  - Si necesita que su receta se enve electrnicamente a una farmacia diferente, informe a nuestra oficina a travs de MyChart de [place] o por telfono llamando al [phone] y presione la opcin 4.

## 2023-05-04 NOTE — Progress Notes (Signed)
 Follow-Up Visit   Subjective  Jesus White is a 78 y.o. male who presents for the following: Skin Cancer Screening and Full Body Skin Exam. Patient with hx of SCCIS. Patient states he did not use 5FU/Calcipotriene mix, patient reports when using in the past he has had a bad reaction.   The patient presents for Total-Body Skin Exam (TBSE) for skin cancer screening and mole check. The patient has spots, moles and lesions to be evaluated, some may be new or changing and the patient may have concern these could be cancer.   The following portions of the chart were reviewed this encounter and updated as appropriate: medications, allergies, medical history  Review of Systems:  No other skin or systemic complaints except as noted in HPI or Assessment and Plan.  Objective  Well appearing patient in no apparent distress; mood and affect are within normal limits.  A full examination was performed including scalp, head, eyes, ears, nose, lips, neck, chest, axillae, abdomen, back, buttocks, bilateral upper extremities, bilateral lower extremities, hands, feet, fingers, toes, fingernails, and toenails. All findings within normal limits unless otherwise noted below.   Relevant physical exam findings are noted in the Assessment and Plan.  Scalp x19, R helix x1, R temple x3, R superior forehead x1, frontal scalp x1, L preauricular x1, L mid cheek x1 (27) Pink scaly macules  Assessment & Plan   SKIN CANCER SCREENING PERFORMED TODAY.  ACTINIC DAMAGE - Chronic condition, secondary to cumulative UV/sun exposure - diffuse scaly erythematous macules with underlying dyspigmentation - Recommend daily broad spectrum sunscreen SPF 30+ to sun-exposed areas, reapply every 2 hours as needed.  - Staying in the shade or wearing long sleeves, sun glasses (UVA+UVB protection) and wide brim hats (4-inch brim around the entire circumference of the hat) are also recommended for sun protection.  - Call for new or  changing lesions.  LENTIGINES, SEBORRHEIC KERATOSES, HEMANGIOMAS - Benign normal skin lesions - Benign-appearing - Call for any changes  MELANOCYTIC NEVI - Tan-brown and/or pink-flesh-colored symmetric macules and papules - Benign appearing on exam today - Observation - Call clinic for new or changing moles - Recommend daily use of broad spectrum spf 30+ sunscreen to sun-exposed areas.   HISTORY OF SQUAMOUS CELL CARCINOMA IN SITU OF THE SKIN Central frontal scalp 10/28/2022 - central frontal scalp, patient did not use topical 5FU/Calcipotriene mix, will continue to observe. May treat in the future if necessary.  - No evidence of recurrence today - Recommend regular full body skin exams - Recommend daily broad spectrum sunscreen SPF 30+ to sun-exposed areas, reapply every 2 hours as needed.  - Call if any new or changing lesions are noted between office visits  AK (ACTINIC KERATOSIS) (27) Scalp x19, R helix x1, R temple x3, R superior forehead x1, frontal scalp x1, L preauricular x1, L mid cheek x1 (27) Actinic keratoses are precancerous spots that appear secondary to cumulative UV radiation exposure/sun exposure over time. They are chronic with expected duration over 1 year. A portion of actinic keratoses will progress to squamous cell carcinoma of the skin. It is not possible to reliably predict which spots will progress to skin cancer and so treatment is recommended to prevent development of skin cancer.  Recommend daily broad spectrum sunscreen SPF 30+ to sun-exposed areas, reapply every 2 hours as needed.  Recommend staying in the shade or wearing long sleeves, sun glasses (UVA+UVB protection) and wide brim hats (4-inch brim around the entire circumference of the hat).  Call for new or changing lesions. Destruction of lesion - Scalp x19, R helix x1, R temple x3, R superior forehead x1, frontal scalp x1, L preauricular x1, L mid cheek x1 (27) Complexity: simple   Destruction method:  cryotherapy   Informed consent: discussed and consent obtained   Timeout:  patient name, date of birth, surgical site, and procedure verified Lesion destroyed using liquid nitrogen: Yes   Region frozen until ice ball extended beyond lesion: Yes   Cryo cycles: 1 or 2. Outcome: patient tolerated procedure well with no complications   Post-procedure details: wound care instructions given   MULTIPLE BENIGN NEVI   LENTIGINES   SEBORRHEIC KERATOSES   ACTINIC ELASTOSIS   CHERRY ANGIOMA    Return in 6 months (on 11/03/2023) for Hx SCCIS, TBSE, w/ Dr. Felipe Horton.  I, Jesus White, CMA, am acting as scribe for Harris Liming, MD .   Documentation: I have reviewed the above documentation for accuracy and completeness, and I agree with the above.  Harris Liming, MD

## 2023-09-30 ENCOUNTER — Ambulatory Visit: Payer: Medicare Other | Admitting: Dermatology

## 2023-11-03 ENCOUNTER — Ambulatory Visit: Admitting: Dermatology

## 2023-11-10 ENCOUNTER — Ambulatory Visit: Admitting: Dermatology

## 2023-11-23 ENCOUNTER — Other Ambulatory Visit
Admission: RE | Admit: 2023-11-23 | Discharge: 2023-11-23 | Disposition: A | Discharge status: Home / Self Care | Source: Ambulatory Visit | Attending: Family Medicine | Admitting: Family Medicine

## 2023-11-23 DIAGNOSIS — I4891 Unspecified atrial fibrillation: Secondary | ICD-10-CM | POA: Insufficient documentation

## 2023-11-23 LAB — TROPONIN I (HIGH SENSITIVITY): Troponin I (High Sensitivity): 7 ng/L (ref <18)

## 2023-11-26 ENCOUNTER — Ambulatory Visit: Admitting: Dermatology

## 2023-12-07 ENCOUNTER — Other Ambulatory Visit: Payer: Self-pay

## 2023-12-07 ENCOUNTER — Ambulatory Visit: Admitting: Anesthesiology

## 2023-12-07 ENCOUNTER — Ambulatory Visit
Admission: RE | Admit: 2023-12-07 | Discharge: 2023-12-07 | Disposition: A | Source: Home / Self Care | Attending: Student | Admitting: Cardiovascular Disease

## 2023-12-07 ENCOUNTER — Ambulatory Visit: Admission: RE | Admit: 2023-12-07 | Source: Home / Self Care | Admitting: Cardiovascular Disease

## 2023-12-07 ENCOUNTER — Ambulatory Visit
Admission: RE | Admit: 2023-12-07 | Discharge: 2023-12-07 | Disposition: A | Discharge status: Home / Self Care | Attending: Cardiovascular Disease | Admitting: Cardiovascular Disease

## 2023-12-07 ENCOUNTER — Encounter
Admission: RE | Disposition: A | Discharge status: Home / Self Care | Payer: Self-pay | Source: Home / Self Care | Attending: Cardiovascular Disease

## 2023-12-07 DIAGNOSIS — I119 Hypertensive heart disease without heart failure: Secondary | ICD-10-CM | POA: Insufficient documentation

## 2023-12-07 DIAGNOSIS — I083 Combined rheumatic disorders of mitral, aortic and tricuspid valves: Secondary | ICD-10-CM | POA: Insufficient documentation

## 2023-12-07 DIAGNOSIS — Z7901 Long term (current) use of anticoagulants: Secondary | ICD-10-CM | POA: Diagnosis not present

## 2023-12-07 DIAGNOSIS — I700 Atherosclerosis of aorta: Secondary | ICD-10-CM | POA: Insufficient documentation

## 2023-12-07 DIAGNOSIS — K219 Gastro-esophageal reflux disease without esophagitis: Secondary | ICD-10-CM | POA: Insufficient documentation

## 2023-12-07 DIAGNOSIS — I4819 Other persistent atrial fibrillation: Secondary | ICD-10-CM | POA: Diagnosis not present

## 2023-12-07 DIAGNOSIS — I4891 Unspecified atrial fibrillation: Secondary | ICD-10-CM | POA: Diagnosis present

## 2023-12-07 LAB — ECHO TEE
MV M vel: 5.39 m/s
MV Peak grad: 116.2 mmHg
Radius: 0.5 cm

## 2023-12-07 SURGERY — ECHOCARDIOGRAM, TRANSESOPHAGEAL
Anesthesia: General

## 2023-12-07 MED ORDER — PROPOFOL 10 MG/ML IV BOLUS
INTRAVENOUS | Status: DC | PRN
  Administered 2023-12-07: 30 mg via INTRAVENOUS
  Administered 2023-12-07: 40 mg via INTRAVENOUS
  Administered 2023-12-07: 30 mg via INTRAVENOUS
  Administered 2023-12-07: 50 mg via INTRAVENOUS
  Administered 2023-12-07: 30 mg via INTRAVENOUS

## 2023-12-07 MED ORDER — LIDOCAINE VISCOUS HCL 2 % MT SOLN
OROMUCOSAL | Status: AC
  Filled 2023-12-07: qty 15

## 2023-12-07 MED ORDER — SODIUM CHLORIDE 0.9 % IV SOLN: INTRAVENOUS | Status: DC

## 2023-12-07 MED ORDER — BUTAMBEN-TETRACAINE-BENZOCAINE 2-2-14 % EX AERO
INHALATION_SPRAY | CUTANEOUS | Status: AC
  Filled 2023-12-07: qty 5

## 2023-12-07 NOTE — H&P (Signed)
 Pre-procedure History & Physical    Patient ID: Jesus White MRN: [id number]; DOB: 07-25-1945   Date of procedure: 12/07/2023  Primary Care Provider: Alla Amis, MD Primary Cardiologist: None   Planned procedure:  TEE-DCCV  HPI:   Jesus White is a 78 y.o. male with paroxysmal AF here for elective TEE-DCCV  Past Medical History:  Diagnosis Date   Anxiety    Arthritis    Atherosclerosis    Cholelithiasis with choledocholithiasis    Diverticulosis    Elevated lipids    GERD (gastroesophageal reflux disease)    Hyperlipemia    Hypertension    Liver lesion    Sepsis (HCC)    Squamous cell carcinoma of skin 10/28/2022   SCCIS central frontal scalp - will discuss tx options with pt    Past Surgical History:  Procedure Laterality Date   CATARACT EXTRACTION Right    CATARACT EXTRACTION W/PHACO Left 04/06/2015   Procedure: CATARACT EXTRACTION PHACO AND INTRAOCULAR LENS PLACEMENT (IOC);  Surgeon: Steven Dingeldein, MD;  Location: [facility] ORS;  Service: Ophthalmology;  Laterality: Left;  US       1:11.7AP%    58.72CDE     32.50fluid casette  lot # [id number]  exp 10/2015   CHOLECYSTECTOMY N/A 11/07/2014   Procedure: LAPAROSCOPIC CHOLECYSTECTOMY;  Surgeon: Vicenta Poli, MD;  Location: [facility] OR;  Service: General;  Laterality: N/A;   COLONOSCOPY     ELBOW ARTHROSCOPY Left    ERCP N/A 11/05/2014   Procedure: ENDOSCOPIC RETROGRADE CHOLANGIOPANCREATOGRAPHY (ERCP);  Surgeon: Norleen Hint, MD;  Location: [facility] OR;  Service: Gastroenterology;  Laterality: N/A;   ERCP N/A 11/05/2014   Procedure: ENDOSCOPIC RETROGRADE CHOLANGIOPANCREATOGRAPHY (ERCP);  Surgeon: Norleen Hint, MD;  Location: [facility] ENDOSCOPY;  Service: Endoscopy;  Laterality: N/A;   EYE SURGERY     HEMORROIDECTOMY     KNEE ARTHROPLASTY Right 09/06/2018   Procedure: COMPUTER ASSISTED TOTAL KNEE ARTHROPLASTY - RNFA;  Surgeon: Mardee Lynwood SQUIBB, MD;  Location: [facility] ORS;  Service: Orthopedics;  Laterality: Right;   KNEE ARTHROPLASTY Left  12/28/2019   Procedure: COMPUTER ASSISTED TOTAL KNEE ARTHROPLASTY;  Surgeon: Mardee Lynwood SQUIBB, MD;  Location: [facility] ORS;  Service: Orthopedics;  Laterality: Left;     Medications Prior to Admission: Prior to Admission medications   Medication Sig Start Date End Date Taking? Authorizing Provider  acetaminophen  (TYLENOL ) 500 MG tablet Take 1,000 mg by mouth every 8 (eight) hours as needed (Joint pain).   Yes [provider]  amLODipine  (NORVASC ) 10 MG tablet Take 10 mg by mouth daily.  09/22/14  Yes [provider]  apixaban (ELIQUIS) 5 MG TABS tablet Take 5 mg by mouth 2 (two) times daily. 11/23/23  Yes [provider]  Cranberry 500 MG TABS Take 500 mg by mouth daily.   Yes [provider]  GINKGO BILOBA PO Take 60 mg by mouth daily.   Yes [provider]  ibuprofen (ADVIL) 200 MG tablet Take 400 mg by mouth every 6 (six) hours as needed for mild pain (pain score 1-3).   Yes [provider]  losartan  (COZAAR ) 50 MG tablet Take 75 mg by mouth daily.    Yes [provider]  lovastatin  (MEVACOR ) 40 MG tablet Take 2 tablets (80 mg total) by mouth at bedtime. HOLD until LFT's are normalized Patient taking differently: Take 80 mg by mouth at bedtime. 11/08/14  Yes Rai, Ripudeep K, MD  metoprolol tartrate (LOPRESSOR) 25 MG tablet Take 25 mg by mouth 2 (two) times daily. 11/23/23  Yes [provider]  milk thistle 175 MG tablet Take 175 mg by mouth daily.    Yes [provider]  Multiple Vitamin (MULTIVITAMIN WITH MINERALS) TABS tablet Take 1 tablet by mouth daily. Men 50+   Yes [provider]  Omega-3 Fatty Acids (FISH OIL) 1000 MG CAPS Take 1,000 mg by mouth daily.   Yes [provider]  omeprazole (PRILOSEC) 20 MG capsule Take 20 mg by mouth daily. 09/04/14  Yes [provider]  PARoxetine  (PAXIL ) 20 MG tablet Take 20 mg by mouth daily.    Yes [provider]  potassium chloride  SA (KLOR-CON   M) 20 MEQ tablet Take 20 mEq by mouth daily. 11/25/23 12/09/23 Yes [provider]  tetrahydrozoline 0.05 % ophthalmic solution Place 1 drop into both eyes daily as needed (eye irritation.).   Yes [provider]  vitamin B-12 (CYANOCOBALAMIN ) 1000 MCG tablet Take 1,000 mcg by mouth daily.   Yes [provider]  Zinc 50 MG TABS Take 50 mg by mouth daily.   Yes [provider]     Allergies:    Allergies  Allergen Reactions   Lisinopril Swelling    Lips and face     Social History:   Social History   Socioeconomic History   Marital status: Married    Spouse name: Not on file   Number of children: Not on file   Years of education: Not on file   Highest education level: Not on file  Occupational History   Not on file  Tobacco Use   Smoking status: Never   Smokeless tobacco: Never  Vaping Use   Vaping status: Never Used  Substance and Sexual Activity   Alcohol use: No   Drug use: Never   Sexual activity: Not on file  Other Topics Concern   Not on file  Social History Narrative   Not on file   Social Drivers of Health   Financial Resource Strain: Low Risk  (10/26/2023)   Received from [facility]   Overall Financial Resource Strain (CARDIA)    Difficulty of Paying Living Expenses: Not hard at all  Food Insecurity: No Food Insecurity (10/26/2023)   Received from [facility]   Hunger Vital Sign    Within the past 12 months, you worried that your food would run out before you got the money to buy more.: Never true    Within the past 12 months, the food you bought just didn't last and you didn't have money to get more.: Never true  Transportation Needs: No Transportation Needs (10/26/2023)   Received from [facility] - Transportation    In the past 12 months, has lack of transportation kept you from medical appointments or from getting medications?: No    Lack of Transportation  (Non-Medical): No  Physical Activity: Not on file  Stress: Not on file  Social Connections: Not on file  Intimate Partner Violence: Not on file     Family History:   The patient's family history is not on file.    ROS:  Please see the history of present illness.  All other ROS reviewed and negative.     Physical Exam/Data:   Vitals:   12/07/23 1153  BP: (!) 148/81  Pulse: 67  Resp: 13  Temp: (!) 97.4 F (36.3 C)  TempSrc: Temporal  SpO2: 97%  Weight: 82 kg  Height: 5' 11 (1.803 m)   No intake  or output data in the 24 hours ending 12/07/23 1205    12/07/2023   11:53 AM 12/28/2019    6:12 AM 12/21/2019    8:06 AM  Last 3 Weights  Weight (lbs) 180 lb 12.4 oz 175 lb 179 lb 3.2 oz  Weight (kg) 82 kg 79.379 kg 81.285 kg     Body mass index is 25.21 kg/m.  Wt Readings from Last 3 Encounters:  12/07/23 82 kg  12/28/19 79.4 kg  12/21/19 81.3 kg    Physical Exam: General: no acute distress. Head: Normocephalic, atraumatic  Neck: supple Lungs: nl effort Heart: nl rate, irreg irreg Abdomen: Soft Msk:  Strength and tone appear normal for age. Extremities: warm, dry Neuro: awake and alert Psych:  Responds to questions appropriately with a normal affect.    Laboratory Data:  ChemistryNo results for input(s): NA, K, CL, CO2, GLUCOSE, BUN, CREATININE, CALCIUM, GFRNONAA, GFRAA, ANIONGAP in the last 168 hours.  No results for input(s): PROT, ALBUMIN, AST, ALT, ALKPHOS, BILITOT in the last 168 hours. HematologyNo results for input(s): WBC, RBC, HGB, HCT, MCV, MCH, MCHC, RDW, PLT in the last 168 hours.  Assessment and Plan   Will proceed w/ TEE-DCCV  ASA III  Anesthesia to be administered by CRNA  Signed, DEARL LEAVEN, MD 12/07/2023, 12:05 PM

## 2023-12-07 NOTE — Transfer of Care (Signed)
 Immediate Anesthesia Transfer of Care Note  Patient: Jesus White  Procedure(s) Performed: ECHOCARDIOGRAM, TRANSESOPHAGEAL CARDIOVERSION  Patient Location: Cath Lab  Anesthesia Type:General  Level of Consciousness: drowsy  Airway & Oxygen Therapy: Patient Spontanous Breathing  Post-op Assessment: Report given to RN  Post vital signs: stable  Last Vitals:  Vitals Value Taken Time  BP    Temp    Pulse    Resp    SpO2      Last Pain:  Vitals:   12/07/23 1153  TempSrc: Temporal  PainSc: 0-No pain         Complications: No notable events documented.

## 2023-12-07 NOTE — Anesthesia Postprocedure Evaluation (Signed)
 Anesthesia Post Note  Patient: Jesus White  Procedure(s) Performed: ECHOCARDIOGRAM, TRANSESOPHAGEAL CARDIOVERSION  Patient location during evaluation: Specials Recovery Anesthesia Type: General Level of consciousness: awake and alert Pain management: pain level controlled Vital Signs Assessment: post-procedure vital signs reviewed and stable Respiratory status: spontaneous breathing, nonlabored ventilation and respiratory function stable Cardiovascular status: blood pressure returned to baseline and stable Postop Assessment: no apparent nausea or vomiting Anesthetic complications: no   No notable events documented.   Last Vitals:  Vitals:   12/07/23 1320 12/07/23 1335  BP: 116/70 128/77  Pulse: 62 60  Resp: 15 16  Temp:    SpO2: 95% 93%    Last Pain:  Vitals:   12/07/23 1335  TempSrc:   PainSc: 0-No pain                 [name]

## 2023-12-07 NOTE — Progress Notes (Signed)
 Transesophageal echocardiogram preliminary report  Jesus White [id number] Jul 02, 1945  Preliminary diagnosis  Atrial fibrillation with or without cardioversion  Postprocedural diagnosis same  Time out A timeout was performed by the nursing staff and physicians specifically identifying the procedure performed, identification of the patient, the type of sedation, all allergies and medications, all pertinent medical history, and presedation assessment of nasopharynx.  The patient and or family understand the risks of the procedure including the rare risks of death, stroke, heart attack, esophogeal perforation, sore throat, and reaction to medications given.  General anesthesia CRNA administered GA. See MAR.  The patient had continued monitoring of heart rate, oxygenation, blood pressure, respiratory rate, and extent of signs of sedation throughout the entire procedure.  The patient received anesthesia over a period of 20 minutes.  CRNA, nursing staff and I were present during the procedure when the patient was anesthetized for 100% of the time.  Treatment considerations No LAA thrombus See separately dictated DCCV note  For further details of transesophageal echocardiogram please refer to final report.  Bonney ROCHER [facility] MD [facility] 12/07/2023 3:04 PM

## 2023-12-07 NOTE — Discharge Instructions (Addendum)
 Echo is scheduled Wednesday 11/19 at 11:00 in [place]. Follow up with Dr. Hilarie Friday 11/21 at 11:00 in [place].

## 2023-12-07 NOTE — Procedures (Signed)
 Electrical Cardioversion Procedure Note  Procedure: Electrical Cardioversion Indications:  Atrial Fibrillation  Procedure Details Consent: Risks of procedure as well as the alternatives and risks of each were explained to the (patient/caregiver).  Consent for procedure obtained.  Time Out Performed: Verified patient identification, verified procedure, site/side was marked, verified correct patient position, special equipment/implants available, medications/allergies/relevent history reviewed, required imaging and test results available.   Patient placed on cardiac monitor, pulse oximetry, supplemental oxygen as necessary.  Sedation given: propofol  per CRNA - 180 mg Pacer pads placed anterior and posterior chest.  TEE was performed prior to DCCV - see separate note  Cardioverted 2 time(s).  Cardioverted at 200J, 250J  Evaluation Findings: Post procedure EKG shows: Atrial Fibrillation Complications: None Patient did tolerate procedure well.   DEARL LEAVEN MD [facility] 3:06 PM 12/07/23

## 2023-12-07 NOTE — Anesthesia Preprocedure Evaluation (Addendum)
 Anesthesia Evaluation  Patient identified by MRN, date of birth, ID band Patient awake    Reviewed: Allergy & Precautions, H&P , NPO status , Patient's Chart, lab work & pertinent test results  Airway Mallampati: II  TM Distance: >3 FB Neck ROM: full    Dental no notable dental hx.    Pulmonary neg pulmonary ROS   Pulmonary exam normal        Cardiovascular hypertension, + dysrhythmias Atrial Fibrillation  Rhythm:Irregular Rate:Normal - Systolic murmurs    Neuro/Psych negative neurological ROS  negative psych ROS   GI/Hepatic Neg liver ROS,GERD  ,,  Endo/Other  negative endocrine ROS    Renal/GU negative Renal ROS  negative genitourinary   Musculoskeletal   Abdominal Normal abdominal exam  (+)   Peds  Hematology negative hematology ROS (+)   Anesthesia Other Findings Past Medical History: No date: Anxiety No date: Arthritis No date: Atherosclerosis No date: Cholelithiasis with choledocholithiasis No date: Diverticulosis No date: Elevated lipids No date: GERD (gastroesophageal reflux disease) No date: Hyperlipemia No date: Hypertension No date: Liver lesion No date: Sepsis (HCC) 10/28/2022: Squamous cell carcinoma of skin     Comment:  SCCIS central frontal scalp - will discuss tx options               with pt  Past Surgical History: No date: CATARACT EXTRACTION; Right 04/06/2015: CATARACT EXTRACTION W/PHACO; Left     Comment:  Procedure: CATARACT EXTRACTION PHACO AND INTRAOCULAR               LENS PLACEMENT (IOC);  Surgeon: Steven Dingeldein, MD;                Location: [facility] ORS;  Service: Ophthalmology;  Laterality:              Left;  US       1:11.7AP%    58.72CDE     32.46fluid               casette  lot # [id number]  exp 10/2015 11/07/2014: CHOLECYSTECTOMY; N/A     Comment:  Procedure: LAPAROSCOPIC CHOLECYSTECTOMY;  Surgeon:               Vicenta Poli, MD;  Location: [facility] OR;  Service:                General;  Laterality: N/A; No date: COLONOSCOPY No date: ELBOW ARTHROSCOPY; Left 11/05/2014: ERCP; N/A     Comment:  Procedure: ENDOSCOPIC RETROGRADE               CHOLANGIOPANCREATOGRAPHY (ERCP);  Surgeon: Norleen Hint,               MD;  Location: [facility] OR;  Service: Gastroenterology;                Laterality: N/A; 11/05/2014: ERCP; N/A     Comment:  Procedure: ENDOSCOPIC RETROGRADE               CHOLANGIOPANCREATOGRAPHY (ERCP);  Surgeon: Norleen Hint,               MD;  Location: [facility] ENDOSCOPY;  Service: Endoscopy;                Laterality: N/A; No date: EYE SURGERY No date: HEMORROIDECTOMY 09/06/2018: KNEE ARTHROPLASTY; Right     Comment:  Procedure: COMPUTER ASSISTED TOTAL KNEE ARTHROPLASTY -               RNFA;  Surgeon: Mardee Lynwood SQUIBB,  MD;  Location: [facility] ORS;              Service: Orthopedics;  Laterality: Right; 12/28/2019: KNEE ARTHROPLASTY; Left     Comment:  Procedure: COMPUTER ASSISTED TOTAL KNEE ARTHROPLASTY;                Surgeon: Mardee Lynwood SQUIBB, MD;  Location: [facility] ORS;                Service: Orthopedics;  Laterality: Left;     Reproductive/Obstetrics negative OB ROS                              Anesthesia Physical Anesthesia Plan  ASA: 3  Anesthesia Plan: General   Post-op Pain Management: Minimal or no pain anticipated   Induction: Intravenous  PONV Risk Score and Plan: Propofol  infusion and TIVA  Airway Management Planned: Natural Airway and Simple Face Mask  Additional Equipment:   Intra-op Plan:   Post-operative Plan:   Informed Consent: I have reviewed the patients History and Physical, chart, labs and discussed the procedure including the risks, benefits and alternatives for the proposed anesthesia with the patient or authorized representative who has indicated his/her understanding and acceptance.     Dental Advisory Given  Plan Discussed with: CRNA and Surgeon  Anesthesia Plan Comments:           Anesthesia Quick Evaluation

## 2023-12-07 NOTE — Progress Notes (Signed)
*  PRELIMINARY RESULTS* Echocardiogram Echocardiogram Transesophageal has been performed.  Jesus White 12/07/2023, 1:01 PM

## 2023-12-08 ENCOUNTER — Encounter: Payer: Self-pay | Admitting: Cardiovascular Disease
